# Patient Record
Sex: Female | Born: 1990 | Race: Black or African American | Hispanic: No | Marital: Single | Smoking: Never smoker
Health system: Southern US, Community
[De-identification: ages and names within clinical notes are randomized; demographics above are authoritative.]

## PROBLEM LIST (undated history)

## (undated) DIAGNOSIS — G35 Multiple sclerosis: Secondary | ICD-10-CM

## (undated) DIAGNOSIS — E559 Vitamin D deficiency, unspecified: Secondary | ICD-10-CM

## (undated) DIAGNOSIS — R0602 Shortness of breath: Secondary | ICD-10-CM

## (undated) DIAGNOSIS — E282 Polycystic ovarian syndrome: Secondary | ICD-10-CM

## (undated) DIAGNOSIS — N915 Oligomenorrhea, unspecified: Secondary | ICD-10-CM

## (undated) DIAGNOSIS — M549 Dorsalgia, unspecified: Secondary | ICD-10-CM

## (undated) HISTORY — DX: Polycystic ovarian syndrome: E28.2

## (undated) HISTORY — DX: Dorsalgia, unspecified: M54.9

## (undated) HISTORY — DX: Multiple sclerosis: G35

## (undated) HISTORY — PX: TONSILLECTOMY: SUR1361

## (undated) HISTORY — DX: Vitamin D deficiency, unspecified: E55.9

## (undated) HISTORY — DX: Shortness of breath: R06.02

## (undated) HISTORY — DX: Oligomenorrhea, unspecified: N91.5

---

## 2015-03-04 LAB — HM PAP SMEAR: HM PAP: NEGATIVE

## 2015-09-09 ENCOUNTER — Emergency Department
Admission: EM | Admit: 2015-09-09 | Discharge: 2015-09-09 | Disposition: A | Discharge status: Home / Self Care | Payer: BLUE CROSS/BLUE SHIELD | Attending: Emergency Medicine | Admitting: Emergency Medicine

## 2015-09-09 ENCOUNTER — Emergency Department: Payer: BLUE CROSS/BLUE SHIELD

## 2015-09-09 ENCOUNTER — Encounter: Payer: Self-pay | Admitting: Emergency Medicine

## 2015-09-09 DIAGNOSIS — R350 Frequency of micturition: Secondary | ICD-10-CM | POA: Insufficient documentation

## 2015-09-09 DIAGNOSIS — R300 Dysuria: Secondary | ICD-10-CM | POA: Diagnosis not present

## 2015-09-09 DIAGNOSIS — N23 Unspecified renal colic: Secondary | ICD-10-CM | POA: Diagnosis not present

## 2015-09-09 DIAGNOSIS — R109 Unspecified abdominal pain: Secondary | ICD-10-CM | POA: Diagnosis present

## 2015-09-09 DIAGNOSIS — R319 Hematuria, unspecified: Secondary | ICD-10-CM | POA: Insufficient documentation

## 2015-09-09 LAB — CBC WITH DIFFERENTIAL/PLATELET
BASOS ABS: 0.1 10*3/uL (ref 0–0.1)
Basophils Relative: 1 %
EOS PCT: 1 %
Eosinophils Absolute: 0.1 10*3/uL (ref 0–0.7)
HEMATOCRIT: 38.8 % (ref 35.0–47.0)
Hemoglobin: 13 g/dL (ref 12.0–16.0)
LYMPHS ABS: 2.7 10*3/uL (ref 1.0–3.6)
LYMPHS PCT: 28 %
MCH: 28.8 pg (ref 26.0–34.0)
MCHC: 33.6 g/dL (ref 32.0–36.0)
MCV: 85.6 fL (ref 80.0–100.0)
MONO ABS: 0.7 10*3/uL (ref 0.2–0.9)
MONOS PCT: 7 %
NEUTROS ABS: 5.9 10*3/uL (ref 1.4–6.5)
Neutrophils Relative %: 63 %
PLATELETS: 378 10*3/uL (ref 150–440)
RBC: 4.53 MIL/uL (ref 3.80–5.20)
RDW: 13.1 % (ref 11.5–14.5)
WBC: 9.4 10*3/uL (ref 3.6–11.0)

## 2015-09-09 LAB — COMPREHENSIVE METABOLIC PANEL
ALT: 23 U/L (ref 14–54)
ANION GAP: 9 (ref 5–15)
AST: 20 U/L (ref 15–41)
Albumin: 4 g/dL (ref 3.5–5.0)
Alkaline Phosphatase: 59 U/L (ref 38–126)
BILIRUBIN TOTAL: 0.6 mg/dL (ref 0.3–1.2)
BUN: 13 mg/dL (ref 6–20)
CHLORIDE: 106 mmol/L (ref 101–111)
CO2: 25 mmol/L (ref 22–32)
Calcium: 9 mg/dL (ref 8.9–10.3)
Creatinine, Ser: 0.88 mg/dL (ref 0.44–1.00)
Glucose, Bld: 110 mg/dL — ABNORMAL HIGH (ref 65–99)
POTASSIUM: 3.3 mmol/L — AB (ref 3.5–5.1)
Sodium: 140 mmol/L (ref 135–145)
TOTAL PROTEIN: 8.5 g/dL — AB (ref 6.5–8.1)

## 2015-09-09 LAB — URINALYSIS COMPLETE WITH MICROSCOPIC (ARMC ONLY)
Bacteria, UA: NONE SEEN
SQUAMOUS EPITHELIAL / LPF: NONE SEEN
Specific Gravity, Urine: 1.032 — ABNORMAL HIGH (ref 1.005–1.030)
WBC, UA: NONE SEEN WBC/hpf (ref 0–5)

## 2015-09-09 LAB — LIPASE, BLOOD: LIPASE: 35 U/L (ref 11–51)

## 2015-09-09 LAB — POCT PREGNANCY, URINE: Preg Test, Ur: NEGATIVE

## 2015-09-09 MED ORDER — KETOROLAC TROMETHAMINE 10 MG PO TABS: 10.0000 mg | ORAL_TABLET | Freq: Three times a day (TID) | ORAL | 0 refills | Status: DC | PRN

## 2015-09-09 MED ORDER — KETOROLAC TROMETHAMINE 30 MG/ML IJ SOLN
30.0000 mg | Freq: Once | INTRAMUSCULAR | Status: AC
  Administered 2015-09-09: 30 mg via INTRAVENOUS

## 2015-09-09 MED ORDER — OXYCODONE-ACETAMINOPHEN 5-325 MG PO TABS: 1.0000 | ORAL_TABLET | ORAL | 0 refills | Status: DC | PRN

## 2015-09-09 MED ORDER — MORPHINE SULFATE (PF) 2 MG/ML IV SOLN
2.0000 mg | Freq: Once | INTRAVENOUS | Status: AC
  Administered 2015-09-09: 2 mg via INTRAVENOUS
  Filled 2015-09-09: qty 1

## 2015-09-09 MED ORDER — ONDANSETRON HCL 4 MG/2ML IJ SOLN
4.0000 mg | Freq: Once | INTRAMUSCULAR | Status: AC
  Administered 2015-09-09: 4 mg via INTRAVENOUS
  Filled 2015-09-09: qty 2

## 2015-09-09 MED ORDER — ONDANSETRON HCL 4 MG PO TABS: 4.0000 mg | ORAL_TABLET | Freq: Three times a day (TID) | ORAL | 0 refills | Status: DC | PRN

## 2015-09-09 MED ORDER — KETOROLAC TROMETHAMINE 30 MG/ML IJ SOLN
INTRAMUSCULAR | Status: AC
  Administered 2015-09-09: 30 mg via INTRAVENOUS
  Filled 2015-09-09: qty 1

## 2015-09-09 MED ORDER — TAMSULOSIN HCL 0.4 MG PO CAPS: 0.4000 mg | ORAL_CAPSULE | Freq: Every day | ORAL | 0 refills | Status: DC

## 2015-09-09 MED ORDER — MORPHINE SULFATE (PF) 4 MG/ML IV SOLN
4.0000 mg | Freq: Once | INTRAVENOUS | Status: AC
  Administered 2015-09-09: 4 mg via INTRAVENOUS
  Filled 2015-09-09: qty 1

## 2015-09-09 NOTE — ED Notes (Signed)
Patient transported to Ultrasound at this time. 

## 2015-09-09 NOTE — ED Provider Notes (Signed)
[facility] Emergency Department Provider Note ____________________________________________   I have reviewed the triage vital signs and the triage nursing note.  HISTORY  Chief Complaint Flank Pain   Historian Patient  HPI Angelica Robles is a 25 y.o. female history of acute onset right flank into the right lower quadrant pain this morning. The waxing and waning but essentially severe. No history of kidney stones. No right upper quadrant pain. Nausea without vomiting. No fevers. Denies diarrhea. She is currently on her period.    History reviewed. No pertinent past medical history.  There are no active problems to display for this patient.   Past Surgical History:  Procedure Laterality Date  . TONSILLECTOMY      Prior to Admission medications   Medication Sig Start Date End Date Taking? Authorizing Provider  ketorolac (TORADOL) 10 MG tablet Take 1 tablet (10 mg total) by mouth every 8 (eight) hours as needed for moderate pain. 09/09/15   Governor Rooks, MD  ondansetron (ZOFRAN) 4 MG tablet Take 1 tablet (4 mg total) by mouth every 8 (eight) hours as needed for nausea or vomiting. 09/09/15   Governor Rooks, MD  oxyCODONE-acetaminophen (ROXICET) 5-325 MG tablet Take 1 tablet by mouth every 4 (four) hours as needed for severe pain. 09/09/15   Governor Rooks, MD  tamsulosin (FLOMAX) 0.4 MG CAPS capsule Take 1 capsule (0.4 mg total) by mouth daily. 09/09/15   Governor Rooks, MD    No Known Allergies  No family history on file.  Social History Social History  Substance Use Topics  . Smoking status: Never Smoker  . Smokeless tobacco: Never Used  . Alcohol use No    Review of Systems  Constitutional: Negative for fever. Eyes: Negative for visual changes. ENT: Negative for sore throat. Cardiovascular: Negative for chest pain. Respiratory: Negative for shortness of breath. Gastrointestinal: Negative for diarrhea. Genitourinary: Positive for frequency and  dysuria. Musculoskeletal: Negative for back pain. Skin: Negative for rash. Neurological: Negative for headache. 10 point Review of Systems otherwise negative ____________________________________________   PHYSICAL EXAM:  VITAL SIGNS: ED Triage Vitals  Enc Vitals Group     BP 09/09/15 0626 106/88     Pulse Rate 09/09/15 0626 81     Resp 09/09/15 0626 18     Temp 09/09/15 0626 98.3 F (36.8 C)     Temp Source 09/09/15 0626 Oral     SpO2 09/09/15 0626 100 %     Weight 09/09/15 0619 230 lb (104.3 kg)     Height 09/09/15 0619 5\' 1"  (1.549 m)     Head Circumference --      Peak Flow --      Pain Score 09/09/15 0621 8     Pain Loc --      Pain Edu? --      Excl. in GC? --      Constitutional: Alert and oriented. Well appearing and in no distress. HEENT   Head: Normocephalic and atraumatic.      Eyes: Conjunctivae are normal. PERRL. Normal extraocular movements.      Ears:         Nose: No congestion/rhinnorhea.   Mouth/Throat: Mucous membranes are moist.   Neck: No stridor. Cardiovascular/Chest: Normal rate, regular rhythm.  No murmurs, rubs, or gallops. Respiratory: Normal respiratory effort without tachypnea nor retractions. Breath sounds are clear and equal bilaterally. No wheezes/rales/rhonchi. Gastrointestinal: Soft. No distention, no guarding, no rebound. Mildly obese with mild tenderness to the right lower quadrant without  focal McBurney's point tenderness  Genitourinary/rectal:Deferred Musculoskeletal: Nontender with normal range of motion in all extremities. No joint effusions.  No lower extremity tenderness.  No edema. Neurologic:  Normal speech and language. No gross or focal neurologic deficits are appreciated. Skin:  Skin is warm, dry and intact. No rash noted. Psychiatric: Mood and affect are normal. Speech and behavior are normal. Patient exhibits appropriate insight and judgment.   ____________________________________________  LABS (pertinent  positives/negatives)  Labs Reviewed  COMPREHENSIVE METABOLIC PANEL - Abnormal; Notable for the following:       Result Value   Potassium 3.3 (*)    Glucose, Bld 110 (*)    Total Protein 8.5 (*)    All other components within normal limits  URINALYSIS COMPLETEWITH MICROSCOPIC ([facility] ONLY) - Abnormal; Notable for the following:    Color, Urine RED (*)    APPearance CLOUDY (*)    Glucose, UA   (*)    Value: TEST NOT REPORTED DUE TO COLOR INTERFERENCE OF URINE PIGMENT   Bilirubin Urine   (*)    Value: TEST NOT REPORTED DUE TO COLOR INTERFERENCE OF URINE PIGMENT   Ketones, ur   (*)    Value: TEST NOT REPORTED DUE TO COLOR INTERFERENCE OF URINE PIGMENT   Specific Gravity, Urine 1.032 (*)    Hgb urine dipstick   (*)    Value: TEST NOT REPORTED DUE TO COLOR INTERFERENCE OF URINE PIGMENT   Protein, ur   (*)    Value: TEST NOT REPORTED DUE TO COLOR INTERFERENCE OF URINE PIGMENT   Nitrite   (*)    Value: TEST NOT REPORTED DUE TO COLOR INTERFERENCE OF URINE PIGMENT   Leukocytes, UA   (*)    Value: TEST NOT REPORTED DUE TO COLOR INTERFERENCE OF URINE PIGMENT   All other components within normal limits  CBC WITH DIFFERENTIAL/PLATELET  LIPASE, BLOOD  POCT PREGNANCY, URINE    ____________________________________________    EKG I, Governor Rooksebecca Amman Bartel, MD, the attending physician have personally viewed and interpreted all ECGs.  None ____________________________________________  RADIOLOGY All Xrays were viewed by me. Imaging interpreted by Radiologist.  Renal ultrasound: IMPRESSION: 1.  Mild right hydronephrosis and hydroureter.  2.  Exam otherwise unremarkable. __________________________________________  PROCEDURES  Procedure(s) performed: None  Critical Care performed: None  ____________________________________________   ED COURSE / ASSESSMENT AND PLAN  Pertinent labs & imaging results that were available during my care of the patient were reviewed by me and considered in my  medical decision making (see chart for details).   [name] is here with symptoms clinically consistent with ureteral colic and here had gross hematuria, not associated for menstrual period.  Symptoms controlled with pain medication here. I discussed with mom and patient my recommendation for either CT for final diagnosis, or I would more likely recommend ultrasound and treat presumptively for renal stone. We chose to avoid CT scan at this point time.   Symptoms not concerning for renal failure, or pyelonephritis. Not at this point concerned about intra-abdominal other emergency such as appendicitis, or pelvic emergency.    CONSULTATIONS:   None   Patient / Family / Caregiver informed of clinical course, medical decision-making process, and agree with plan.   I discussed return precautions, follow-up instructions, and discharge instructions with patient and/or family.   ___________________________________________   FINAL CLINICAL IMPRESSION(S) / ED DIAGNOSES   Final diagnoses:  Hematuria  Ureteral colic              Note: This  dictation was prepared with Dragon dictation. Any transcriptional errors that result from this process are unintentional    Governor Rooks, MD 09/09/15 1041

## 2015-09-09 NOTE — Discharge Instructions (Signed)
You were evaluated her right flank and abdominal pain, and bloody urine, and your evaluation seems consistent with kidney stone. As we discussed, I did not recommend imaging today with CT scan.  Strain your urine and if you collect a stone, turn it into your primary care physician's office or [facility] clinic for analysis.  Return to the emergency room for any worsening pain, inability to urinate, fever, vomiting and cannot keep her medications down, or any other symptoms concerning to you.

## 2015-09-09 NOTE — ED Triage Notes (Signed)
Pt to triage via w/c, appears uncomfortable; st awoke at 430am with right flank pain radiating into right abd with no accomp symptoms

## 2015-09-10 LAB — URINE CULTURE

## 2016-05-24 ENCOUNTER — Other Ambulatory Visit: Payer: Self-pay

## 2016-05-24 MED ORDER — NORETHIN-ETH ESTRAD-FE BIPHAS 1 MG-10 MCG / 10 MCG PO TABS: 1.0000 | ORAL_TABLET | Freq: Every day | ORAL | 1 refills | Status: DC

## 2016-05-24 NOTE — Telephone Encounter (Signed)
Pt aware refills eRx'd. 

## 2016-06-22 ENCOUNTER — Other Ambulatory Visit: Payer: Self-pay

## 2016-06-22 MED ORDER — NORETHIN-ETH ESTRAD-FE BIPHAS 1 MG-10 MCG / 10 MCG PO TABS: 1.0000 | ORAL_TABLET | Freq: Every day | ORAL | 1 refills | Status: DC

## 2016-07-03 ENCOUNTER — Ambulatory Visit: Payer: Self-pay | Admitting: Obstetrics and Gynecology

## 2016-08-09 ENCOUNTER — Ambulatory Visit: Payer: Self-pay | Admitting: Obstetrics and Gynecology

## 2016-08-10 ENCOUNTER — Telehealth: Payer: Self-pay | Admitting: Obstetrics & Gynecology

## 2016-08-10 NOTE — Telephone Encounter (Signed)
LVM for patient to call back to r/s due to EPIC Outage 08/10/16

## 2016-08-14 ENCOUNTER — Other Ambulatory Visit: Payer: Self-pay

## 2016-08-14 MED ORDER — NORETHIN-ETH ESTRAD-FE BIPHAS 1 MG-10 MCG / 10 MCG PO TABS: 1.0000 | ORAL_TABLET | Freq: Every day | ORAL | 0 refills | Status: DC

## 2016-08-14 NOTE — Telephone Encounter (Signed)
rx sent pt aware 

## 2016-08-23 ENCOUNTER — Other Ambulatory Visit: Payer: Self-pay | Admitting: Obstetrics and Gynecology

## 2016-08-24 ENCOUNTER — Other Ambulatory Visit: Payer: Self-pay | Admitting: Obstetrics and Gynecology

## 2016-08-25 ENCOUNTER — Other Ambulatory Visit: Payer: Self-pay | Admitting: Obstetrics and Gynecology

## 2016-08-25 NOTE — Telephone Encounter (Signed)
Needs annual scheduled refilled for 2 months

## 2016-08-30 ENCOUNTER — Encounter: Payer: Self-pay | Admitting: Obstetrics and Gynecology

## 2016-09-05 ENCOUNTER — Ambulatory Visit (INDEPENDENT_AMBULATORY_CARE_PROVIDER_SITE_OTHER): Payer: 59 | Admitting: Obstetrics and Gynecology

## 2016-09-05 ENCOUNTER — Encounter: Payer: Self-pay | Admitting: Obstetrics and Gynecology

## 2016-09-05 VITALS — BP 130/78 | HR 113 | Ht 61.0 in | Wt 242.0 lb

## 2016-09-05 DIAGNOSIS — E669 Obesity, unspecified: Secondary | ICD-10-CM

## 2016-09-05 DIAGNOSIS — Z01419 Encounter for gynecological examination (general) (routine) without abnormal findings: Secondary | ICD-10-CM

## 2016-09-05 DIAGNOSIS — N912 Amenorrhea, unspecified: Secondary | ICD-10-CM | POA: Diagnosis not present

## 2016-09-05 DIAGNOSIS — Z6841 Body Mass Index (BMI) 40.0 and over, adult: Secondary | ICD-10-CM

## 2016-09-05 DIAGNOSIS — N911 Secondary amenorrhea: Secondary | ICD-10-CM

## 2016-09-05 DIAGNOSIS — IMO0001 Reserved for inherently not codable concepts without codable children: Secondary | ICD-10-CM

## 2016-09-05 LAB — POCT URINE PREGNANCY: Preg Test, Ur: NEGATIVE

## 2016-09-05 MED ORDER — MEDROXYPROGESTERONE ACETATE 10 MG PO TABS: 10.0000 mg | ORAL_TABLET | Freq: Every day | ORAL | 0 refills | Status: DC

## 2016-09-05 NOTE — Patient Instructions (Signed)
Preventive Care 18-39 Years, Female Preventive care refers to lifestyle choices and visits with your health care provider that can promote health and wellness. What does preventive care include?  A yearly physical exam. This is also called an annual well check.  Dental exams once or twice a year.  Routine eye exams. Ask your health care provider how often you should have your eyes checked.  Personal lifestyle choices, including: ? Daily care of your teeth and gums. ? Regular physical activity. ? Eating a healthy diet. ? Avoiding tobacco and drug use. ? Limiting alcohol use. ? Practicing safe sex. ? Taking vitamin and mineral supplements as recommended by your health care provider. What happens during an annual well check? The services and screenings done by your health care provider during your annual well check will depend on your age, overall health, lifestyle risk factors, and family history of disease. Counseling Your health care provider may ask you questions about your:  Alcohol use.  Tobacco use.  Drug use.  Emotional well-being.  Home and relationship well-being.  Sexual activity.  Eating habits.  Work and work Statistician.  Method of birth control.  Menstrual cycle.  Pregnancy history.  Screening You may have the following tests or measurements:  Height, weight, and BMI.  Diabetes screening. This is done by checking your blood sugar (glucose) after you have not eaten for a while (fasting).  Blood pressure.  Lipid and cholesterol levels. These may be checked every 5 years starting at age 66.  Skin check.  Hepatitis C blood test.  Hepatitis B blood test.  Sexually transmitted disease (STD) testing.  BRCA-related cancer screening. This may be done if you have a family history of breast, ovarian, tubal, or peritoneal cancers.  Pelvic exam and Pap test. This may be done every 3 years starting at age 40. Starting at age 59, this may be done every 5  years if you have a Pap test in combination with an HPV test.  Discuss your test results, treatment options, and if necessary, the need for more tests with your health care provider. Vaccines Your health care provider may recommend certain vaccines, such as:  Influenza vaccine. This is recommended every year.  Tetanus, diphtheria, and acellular pertussis (Tdap, Td) vaccine. You may need a Td booster every 10 years.  Varicella vaccine. You may need this if you have not been vaccinated.  HPV vaccine. If you are 69 or younger, you may need three doses over 6 months.  Measles, mumps, and rubella (MMR) vaccine. You may need at least one dose of MMR. You may also need a second dose.  Pneumococcal 13-valent conjugate (PCV13) vaccine. You may need this if you have certain conditions and were not previously vaccinated.  Pneumococcal polysaccharide (PPSV23) vaccine. You may need one or two doses if you smoke cigarettes or if you have certain conditions.  Meningococcal vaccine. One dose is recommended if you are age 27-21 years and a first-year college student living in a residence hall, or if you have one of several medical conditions. You may also need additional booster doses.  Hepatitis A vaccine. You may need this if you have certain conditions or if you travel or work in places where you may be exposed to hepatitis A.  Hepatitis B vaccine. You may need this if you have certain conditions or if you travel or work in places where you may be exposed to hepatitis B.  Haemophilus influenzae type b (Hib) vaccine. You may need this if  you have certain risk factors.  Talk to your health care provider about which screenings and vaccines you need and how often you need them. This information is not intended to replace advice given to you by your health care provider. Make sure you discuss any questions you have with your health care provider. Document Released: 02/14/2001 Document Revised: 09/08/2015  Document Reviewed: 10/20/2014 Elsevier Interactive Patient Education  2017 [employer].

## 2016-09-05 NOTE — Progress Notes (Signed)
Patient ID: Angelica Robles, female   DOB: 1990-07-04, 26 y.o.   MRN: [id number]     Gynecology Annual Exam  PCP: [facility] [place], [place]  Chief Complaint:  Chief Complaint  Patient presents with  . Gynecologic Exam    missed cycle x going on 3 months    History of Present Illness: Patient is a 26 y.o. G0P0000 presents for annual exam. The patient has no complaints today.   LMP: Patient's last menstrual period was 07/07/2016 (exact date). Average Interval: irregular, 30-90 days days Duration of flow: 7-21 days days Heavy Menses: yes Clots: yes Intermenstrual Bleeding: no Postcoital Bleeding: no Dysmenorrhea: yes  The patient is sexually active. She currently uses none for contraception. She denies dyspareunia.  The patient does perform self breast exams.  There is no notable family history of breast or ovarian cancer in her family.  The patient wears seatbelts: yes.  The patient has regular exercise: not asked.    The patient denies current symptoms of depression.    Mcguirt standing history of anovulatory cycles.  Has had normal TVUS and endometrial biopsy.  Last time labs were evaluate was 2014.  She has been managed with OCP's in the past.  She is not currently interested in conceiving.   Hyperprolactinemia:  Denies nipple discharge, headaches, vision changes Thyroid:  Does report cold intolerance, weight stable, some constipation, no skin or hair changes PCOS: mild hirsutism, no acne, weight stable    Review of Systems: Review of Systems  Constitutional: Negative for chills and fever.  HENT: Negative for congestion.   Respiratory: Negative for cough and shortness of breath.   Cardiovascular: Negative for chest pain and palpitations.  Gastrointestinal: Negative for abdominal pain, constipation, diarrhea, heartburn, nausea and vomiting.  Genitourinary: Negative for dysuria, frequency and urgency.  Skin: Negative for itching and rash.  Neurological: Negative for  dizziness and headaches.  Endo/Heme/Allergies: Negative for polydipsia.  Psychiatric/Behavioral: Negative for depression.    Past Medical History:  Past Medical History:  Diagnosis Date  . Oligomenorrhea     Past Surgical History:  Past Surgical History:  Procedure Laterality Date  . TONSILLECTOMY      Gynecologic History:  Patient's last menstrual period was 07/07/2016 (exact date). Contraception: none Last Pap: Results were: 03/04/15 no abnormalities   Obstetric History: G0P0000  Family History:  Family History  Problem Relation Age of Onset  . Prostate cancer Father 45  . Breast cancer Maternal Grandmother 56    Social History:  Social History   Social History  . Marital status: Single    Spouse name: N/A  . Number of children: N/A  . Years of education: N/A   Occupational History  . Not on file.   Social History Main Topics  . Smoking status: Never Smoker  . Smokeless tobacco: Never Used  . Alcohol use No  . Drug use: No  . Sexual activity: Yes    Birth control/ protection: Pill   Other Topics Concern  . Not on file   Social History Narrative  . No narrative on file    Allergies:  No Known Allergies  Medications: Prior to Admission medications   Medication Sig Start Date End Date Taking? Authorizing Provider  Norethindrone-Ethinyl Estradiol-Fe Biphas (LO LOESTRIN FE) 1 MG-10 MCG / 10 MCG tablet Take 1 tablet by mouth daily. 08/25/16  Yes Vena Austria, MD    Physical Exam Vitals: Blood pressure 130/78, pulse (!) 113, height 5\' 1"  (1.549 m), weight 242 lb (109.8  kg), last menstrual period 07/07/2016. Was 234 1 year ago  General: NAD HEENT: normocephalic, anicteric Thyroid: no enlargement, no palpable nodules Pulmonary: No increased work of breathing, CTAB Cardiovascular: RRR, distal pulses 2+ Breast: Breast symmetrical, no tenderness, no palpable nodules or masses, no skin or nipple retraction present, no nipple discharge.  No axillary  or supraclavicular lymphadenopathy. Abdomen: NABS, soft, non-tender, non-distended.  Umbilicus without lesions.  No hepatomegaly, splenomegaly or masses palpable. No evidence of hernia  Genitourinary:  External: Normal external female genitalia.  Normal urethral meatus, normal  Bartholin's and Skene's glands.    Vagina: Normal vaginal mucosa, no evidence of prolapse.    Cervix: Grossly normal in appearance, no bleeding  Uterus: Non-enlarged, mobile, normal contour.  No CMT  Adnexa: ovaries non-enlarged, no adnexal masses  Rectal: deferred  Lymphatic: no evidence of inguinal lymphadenopathy Extremities: no edema, erythema, or tenderness Neurologic: Grossly intact Psychiatric: mood appropriate, affect full  Female chaperone present for pelvic and breast  portions of the physical exam    Assessment: 26 y.o. G0P0000 routine annual exam, secondary amenorrhea  Plan: Problem List Items Addressed This Visit    None    Visit Diagnoses    Amenorrhea    -  Primary   Relevant Orders   POCT urine pregnancy (Completed)   TSH+Prl+FSH+TestT+LH+DHEA S...   Encounter for gynecological examination without abnormal finding       Relevant Orders   TSH+Prl+FSH+TestT+LH+DHEA S...   Hemoglobin A1c   Secondary amenorrhea       Relevant Orders   TSH+Prl+FSH+TestT+LH+DHEA S...   Hemoglobin A1c   Class 3 obesity with serious comorbidity and body mass index (BMI) of 40.0 to 44.9 in adult, unspecified obesity type (HCC)       Relevant Orders   TSH+Prl+FSH+TestT+LH+DHEA S...   Hemoglobin A1c      1) 4) Gardasil Series discussed and if applicable offered to patient - Patient has not previously completed 3 shot series, declined previously  2) STI screening was offered and declined   3) ASCCP guidelines and rational discussed.  Patient opts for every 3 years screening interval  4) Contraception - discussed options for management.  Start po progestin if labs normal vs OCP.  Suspect secondary  amenorrhea secondary to PCOS  5) Follow up 1 year for routine annual exam

## 2016-09-08 LAB — HEMOGLOBIN A1C
Est. average glucose Bld gHb Est-mCnc: 111 mg/dL
HEMOGLOBIN A1C: 5.5 % (ref 4.8–5.6)

## 2016-09-08 LAB — TSH+PRL+FSH+TESTT+LH+DHEA S...
17 HYDROXYPROGESTERONE: 17 ng/dL
Androstenedione: 50 ng/dL (ref 41–262)
DHEA-SO4: 210 ug/dL (ref 84.8–378.0)
FSH: 5.2 m[IU]/mL
LH: 8 m[IU]/mL
PROLACTIN: 21.2 ng/mL (ref 4.8–23.3)
TESTOSTERONE FREE: 1.7 pg/mL (ref 0.0–4.2)
TESTOSTERONE: 31 ng/dL (ref 8–48)
TSH: 1.57 u[IU]/mL (ref 0.450–4.500)

## 2016-10-02 ENCOUNTER — Other Ambulatory Visit: Payer: Self-pay | Admitting: Obstetrics and Gynecology

## 2016-10-02 MED ORDER — NORETHIN-ETH ESTRAD-FE BIPHAS 1 MG-10 MCG / 10 MCG PO TABS: 1.0000 | ORAL_TABLET | Freq: Every day | ORAL | 1 refills | Status: DC

## 2016-10-02 NOTE — Telephone Encounter (Signed)
Needs appointment

## 2016-10-02 NOTE — Telephone Encounter (Signed)
Annual was 09/05/2016, What does she need to be seen for? I need to let front desk know what to schedule

## 2016-10-02 NOTE — Telephone Encounter (Signed)
Sorry this rx was for withdrawal bleed only  she should start a regular birth control pill now I've resent that rx

## 2016-10-03 NOTE — Telephone Encounter (Signed)
Attempted to reach patient her Voicemail box was full. Called and spoke with Angelica Robles patient's mother about scheduling appt 10/06/16 with AMS for medication follow. Angelica Robles was advise to contact paient to have her call back back to comfirm schedule appt/

## 2016-10-03 NOTE — Telephone Encounter (Signed)
Please call patient and schedule for medication follow up. First available Thank you

## 2016-10-06 ENCOUNTER — Encounter: Payer: Self-pay | Admitting: Obstetrics and Gynecology

## 2016-10-06 ENCOUNTER — Ambulatory Visit: Payer: 59 | Admitting: Obstetrics and Gynecology

## 2016-10-06 ENCOUNTER — Ambulatory Visit (INDEPENDENT_AMBULATORY_CARE_PROVIDER_SITE_OTHER): Payer: 59 | Admitting: Obstetrics and Gynecology

## 2016-10-06 VITALS — BP 118/78 | HR 92 | Ht 61.0 in | Wt 241.0 lb

## 2016-10-06 DIAGNOSIS — E282 Polycystic ovarian syndrome: Secondary | ICD-10-CM | POA: Diagnosis not present

## 2016-10-06 DIAGNOSIS — N911 Secondary amenorrhea: Secondary | ICD-10-CM

## 2016-10-06 NOTE — Progress Notes (Signed)
Obstetrics & Gynecology Office Visit   Chief Complaint:  Chief Complaint  Patient presents with  . Medication Refill    History of Present Illness: 26 year old AAF presenting for follow up of secondary amenorrhea.  No history of prior uterine procedures.  Ultrasound 1 year ago for AUB was normal.  She completed a PCOS panel which was negative although given amenorrhea, as well as clinical signs of androgen excess (hirsutism and acne), the most likely diagnosis is PCOS.  She completed at 10 day course of provera.  She did not have a full menstrual cycle but she did have some spotting following completion of the course.  She presents today to go over follow up recommendations.  Review of Systems: 10 point review of systems negative unless otherwise noted in HPI  Past Medical History:  Past Medical History:  Diagnosis Date  . Oligomenorrhea     Past Surgical History:  Past Surgical History:  Procedure Laterality Date  . TONSILLECTOMY      Gynecologic History: No LMP recorded.  Obstetric History: G0P0000  Family History:  Family History  Problem Relation Age of Onset  . Prostate cancer Father 65  . Breast cancer Maternal Grandmother 57    Social History:  Social History   Social History  . Marital status: Single    Spouse name: N/A  . Number of children: N/A  . Years of education: N/A   Occupational History  . Not on file.   Social History Main Topics  . Smoking status: Never Smoker  . Smokeless tobacco: Never Used  . Alcohol use No  . Drug use: No  . Sexual activity: Yes    Birth control/ protection: Pill   Other Topics Concern  . Not on file   Social History Narrative  . No narrative on file    Allergies:  No Known Allergies  Medications: Prior to Admission medications   Medication Sig Start Date End Date Taking? Authorizing Provider  medroxyPROGESTERone (PROVERA) 10 MG tablet TAKE 1 TABLET (10 MG TOTAL) BY MOUTH DAILY. START AFTER FIRST 14  DAYS OF PREMARIN 09/05/16  Yes [provider]  Norethindrone-Ethinyl Estradiol-Fe Biphas (LO LOESTRIN FE) 1 MG-10 MCG / 10 MCG tablet Take 1 tablet by mouth daily. 10/02/16  Yes Vena Austria, MD    Physical Exam Vitals:  Vitals:   10/06/16 1131  BP: 118/78  Pulse: 92   No LMP recorded.  General: NAD HEENT: normocephalic, anicteric Pulmonary: No increased work of breathing Extremities: no edema, erythema, or tenderness Neurologic: Grossly intact Psychiatric: mood appropriate, affect full  Female chaperone present for pelvic and breast  portions of the physical exam  Assessment: 26 y.o. G0P0000 follow up secondary amenorrhea  Plan: Problem List Items Addressed This Visit    None    Visit Diagnoses    Secondary amenorrhea    -  Primary     - Trial of taytulla (was previously on ortho-cyclen, prior to that lo loestring fe) - Has some light spotting but no real menstraul flow after provera - The patient is not currently interested in conceiving, we discussed in this setting endometrial protection is the primary treatment goal.  We dsicussed implications of extended periods of amenorrhea on development of endometrial hyperplasia.  ("Polycystic Ovarian Syndrome" ACOG Practice Bulletin 194 June 2018).   - A total of 15 minutes were spent in face-to-face contact with the patient during this encounter with over half of that time devoted to counseling and  coordination of care.

## 2016-12-04 ENCOUNTER — Telehealth: Payer: Self-pay

## 2016-12-04 ENCOUNTER — Other Ambulatory Visit: Payer: Self-pay | Admitting: Obstetrics and Gynecology

## 2016-12-04 MED ORDER — NORETHIN ACE-ETH ESTRAD-FE 1-20 MG-MCG(24) PO CAPS: 1.0000 | ORAL_CAPSULE | Freq: Every day | ORAL | 3 refills | Status: DC

## 2016-12-04 NOTE — Telephone Encounter (Signed)
Pt requesting refill of Taytulla. Cb#[phone]

## 2016-12-07 NOTE — Telephone Encounter (Signed)
Spoke w/pt. Gave info to activate coupon card. Pt has done & will contact pharmacy to verify cost & contact us back if she needs further assistance.

## 2016-12-07 NOTE — Telephone Encounter (Signed)
Spoke w/pharmacist. Gave Coupon code. Not valid until activated. Will contact pt to have her activate.

## 2016-12-07 NOTE — Telephone Encounter (Signed)
Pt states Taytulla is $100. Requesting coupon or generic. Cb#[phone]

## 2017-01-21 IMAGING — US US RENAL
1 series · 14 of 25 positions shown · non-contrast
Comparison: No recent prior.

CLINICAL DATA: Right flank pain.  Hematuria.

EXAM:
RENAL / URINARY TRACT ULTRASOUND COMPLETE

[Series 1: us renal · 0.23mm/px · 14 of 39 slices shown]
[im 1/39]
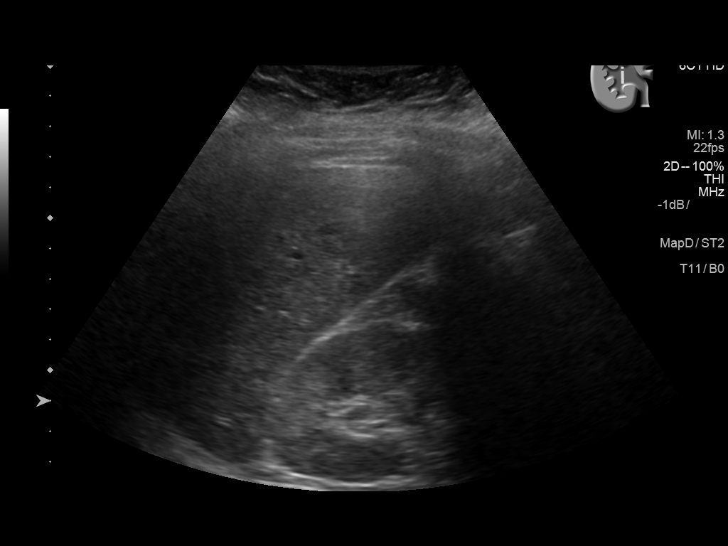
[im 4/39]
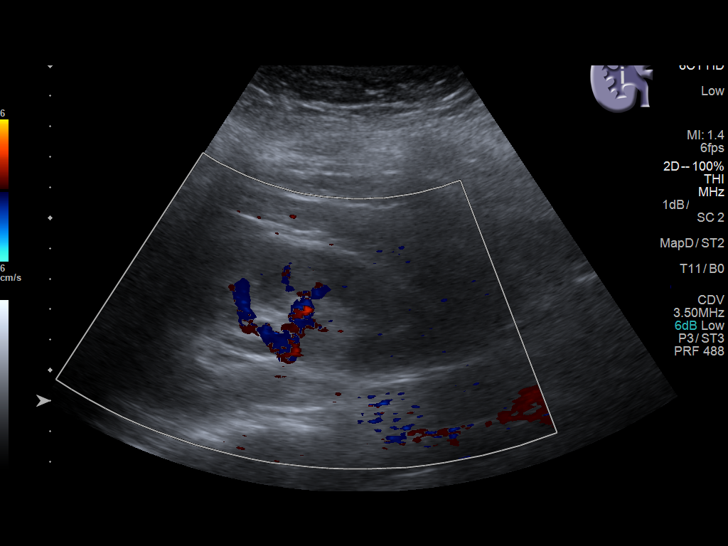
[im 7/39]
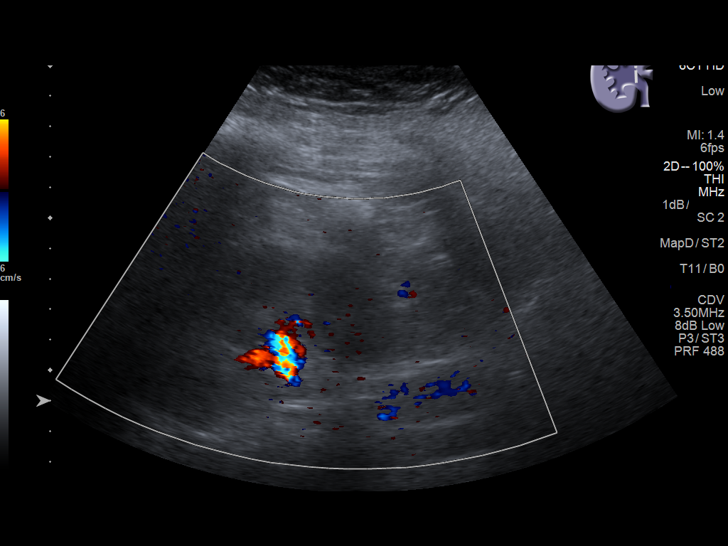
[im 10/39]
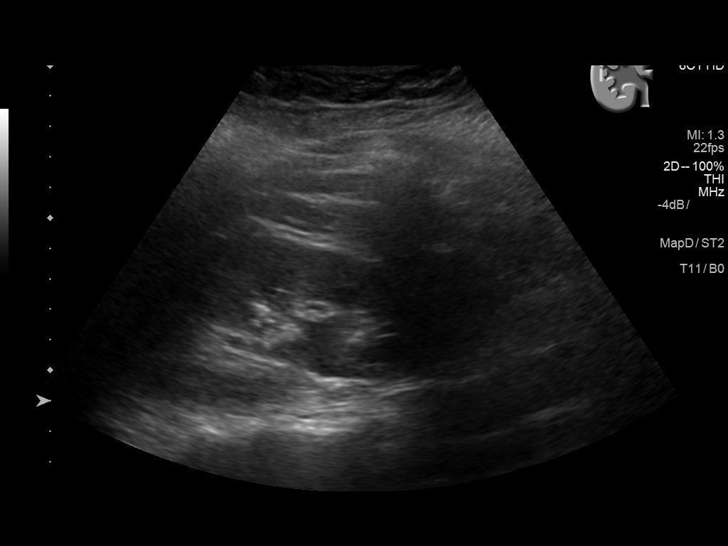
[im 13/39]
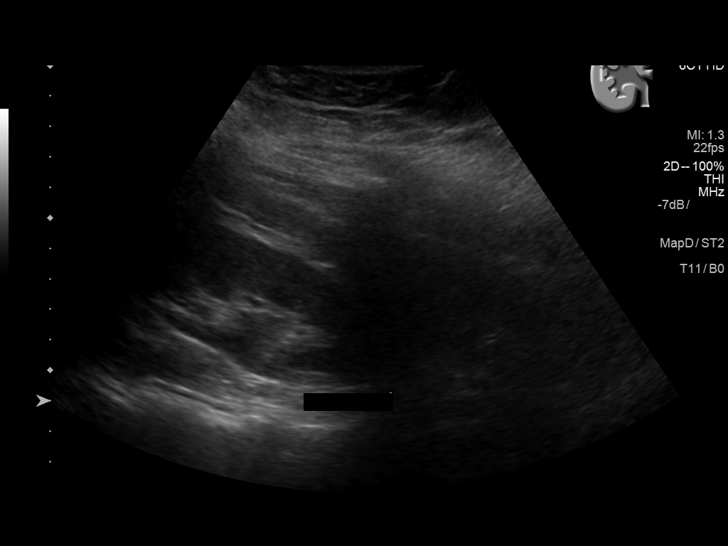
[im 15/39]
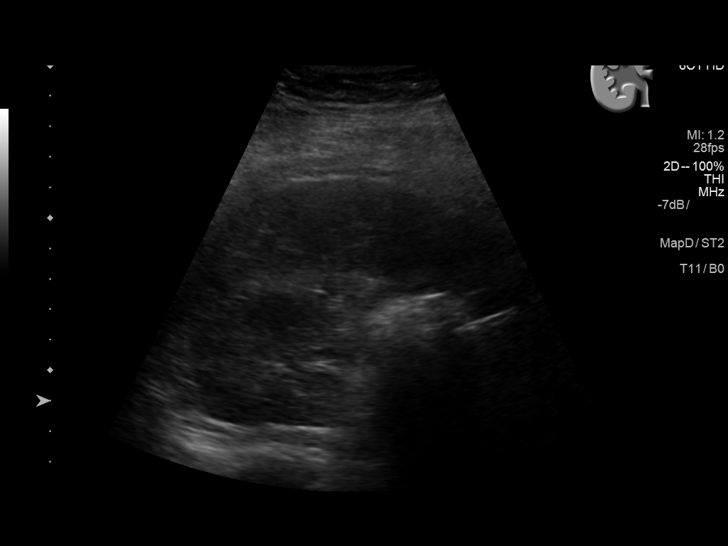
[im 18/39]
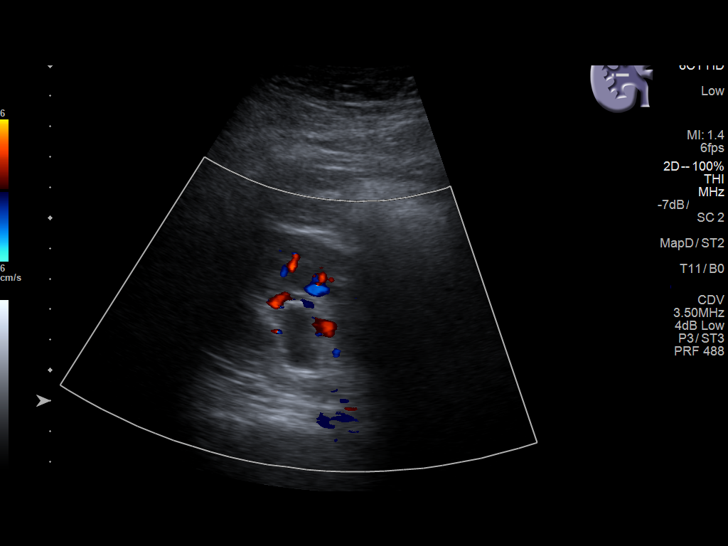
[im 21/39]
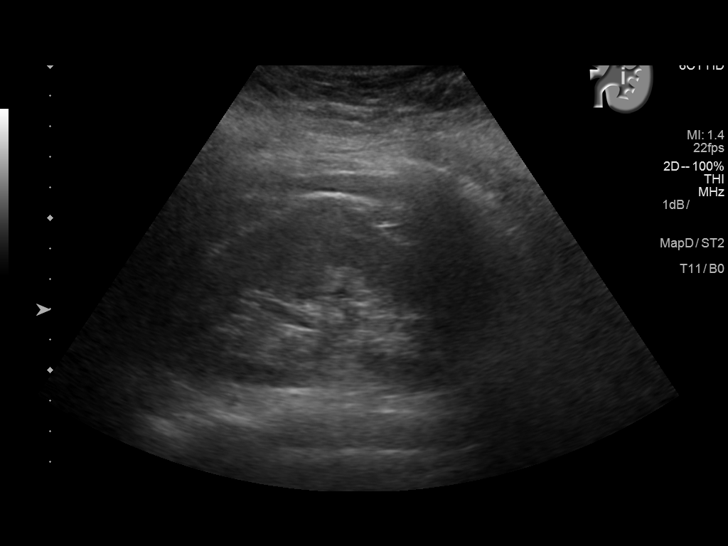
[im 24/39]
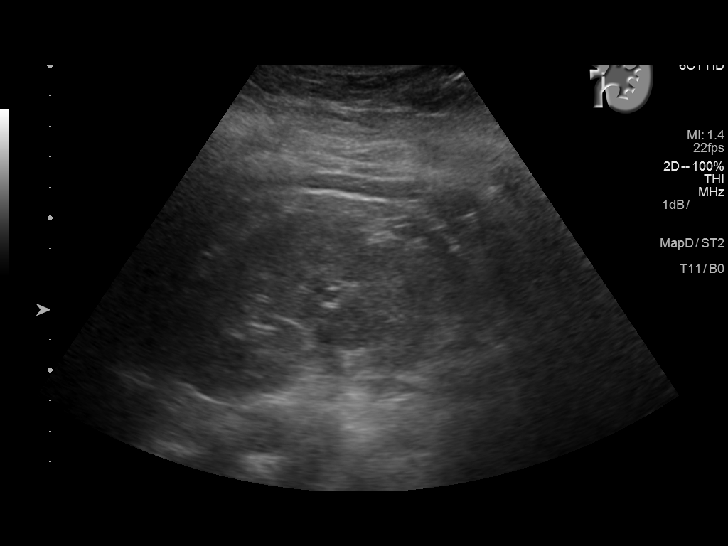
[im 26/39]
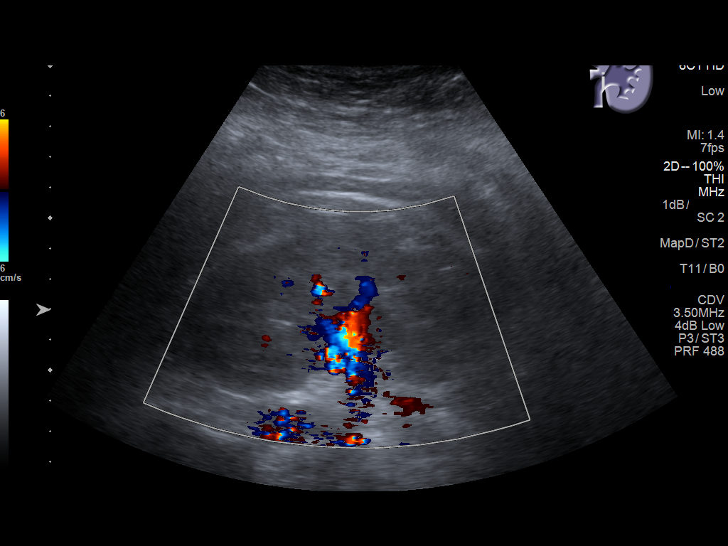
[im 29/39]
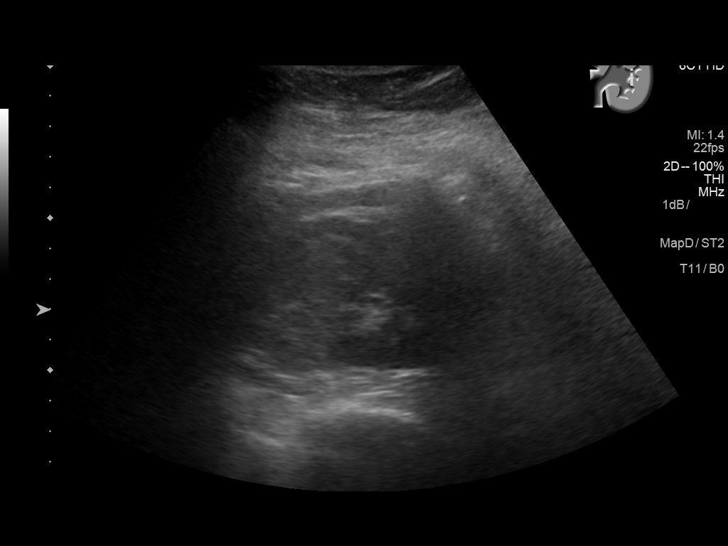
[im 32/39]
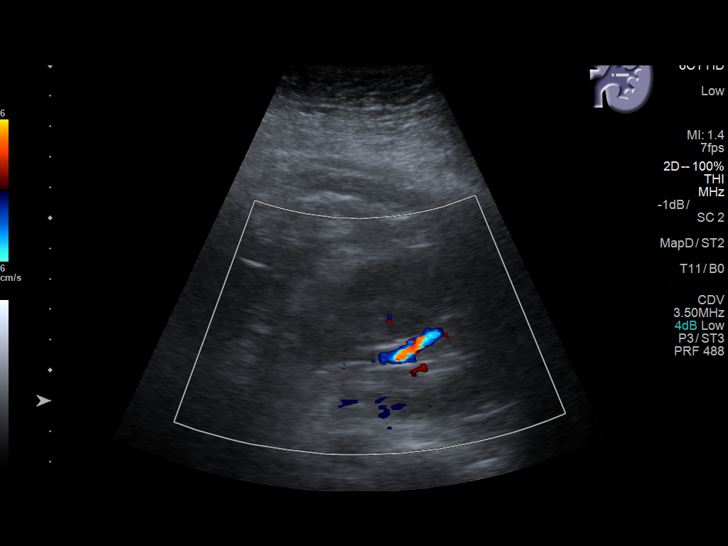
[im 35/39]
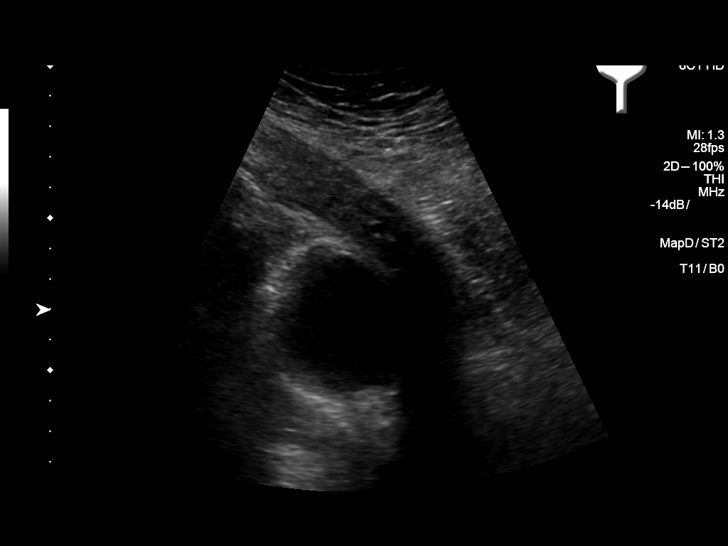
[im 39/39]
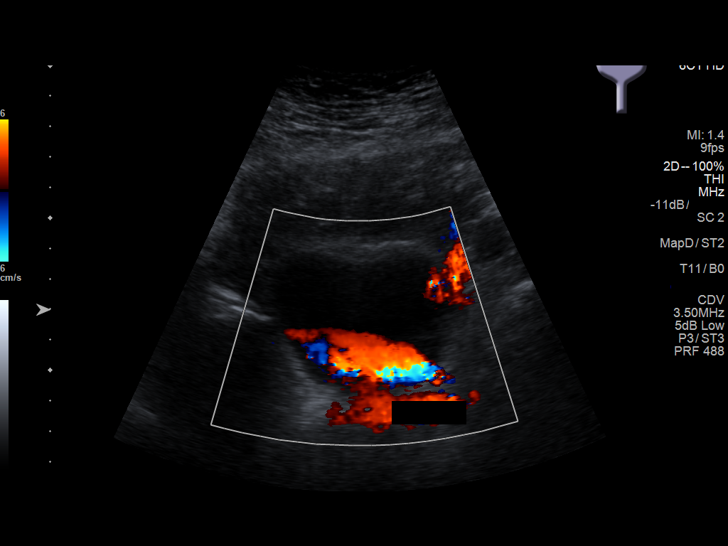

[14 of 25 positions shown; findings below may reference images not displayed]

FINDINGS: Right Kidney:

Length: 10.4 cm. Echogenicity within normal limits. No mass. Mild
right hydronephrosis and hydroureter noted.

Left Kidney:

Length: 10.1 cm. Echogenicity within normal limits. No mass or
hydronephrosis visualized.

Bladder:

Appears normal for degree of bladder distention. Bilateral ureteral
jets noted.
IMPRESSION: 1.  Mild right hydronephrosis and hydroureter.

2.  Exam otherwise unremarkable.

## 2017-09-17 ENCOUNTER — Ambulatory Visit (INDEPENDENT_AMBULATORY_CARE_PROVIDER_SITE_OTHER): Payer: Managed Care, Other (non HMO) | Admitting: Obstetrics and Gynecology

## 2017-09-17 ENCOUNTER — Encounter: Payer: Self-pay | Admitting: Obstetrics and Gynecology

## 2017-09-17 VITALS — BP 124/76 | HR 103 | Ht 61.0 in | Wt 252.0 lb

## 2017-09-17 DIAGNOSIS — Z1231 Encounter for screening mammogram for malignant neoplasm of breast: Secondary | ICD-10-CM

## 2017-09-17 DIAGNOSIS — Z131 Encounter for screening for diabetes mellitus: Secondary | ICD-10-CM | POA: Diagnosis not present

## 2017-09-17 DIAGNOSIS — Z01419 Encounter for gynecological examination (general) (routine) without abnormal findings: Secondary | ICD-10-CM

## 2017-09-17 DIAGNOSIS — Z1239 Encounter for other screening for malignant neoplasm of breast: Secondary | ICD-10-CM

## 2017-09-17 DIAGNOSIS — Z6841 Body Mass Index (BMI) 40.0 and over, adult: Secondary | ICD-10-CM | POA: Diagnosis not present

## 2017-09-17 DIAGNOSIS — Z1329 Encounter for screening for other suspected endocrine disorder: Secondary | ICD-10-CM | POA: Diagnosis not present

## 2017-09-17 DIAGNOSIS — Z113 Encounter for screening for infections with a predominantly sexual mode of transmission: Secondary | ICD-10-CM

## 2017-09-17 MED ORDER — NORGESTIMATE-ETH ESTRADIOL 0.25-35 MG-MCG PO TABS: 1.0000 | ORAL_TABLET | Freq: Every day | ORAL | 11 refills | Status: DC

## 2017-09-17 MED ORDER — PHENTERMINE HCL 37.5 MG PO TABS: 37.5000 mg | ORAL_TABLET | Freq: Every day | ORAL | 0 refills | Status: DC

## 2017-09-17 NOTE — Progress Notes (Signed)
Gynecology Annual Exam   PCP: [facility], [place]  Chief Complaint:  Chief Complaint  Patient presents with  . Gynecologic Exam  . discuss weight loss    History of Present Illness: Patient is a 27 y.o. G0P0000 presents for annual exam. The patient has no complaints today.   LMP: Patient's last menstrual period was 09/11/2017 (exact date). Average Interval: regular, 28 days Duration of flow: 4 days Heavy Menses: no Clots: no Intermenstrual Bleeding: no Postcoital Bleeding: no Dysmenorrhea: no  She is having regular menses but these occur on week 1 of her next pill pack on the [place].    The patient is sexually active. She currently uses OCP (estrogen/progesterone) for contraception. She denies dyspareunia.  The patient does perform self breast exams.  There is no notable family history of breast or ovarian cancer in her family.  The patient wears seatbelts: yes.   The patient has regular exercise: not asked.    The patient denies current symptoms of depression.     Patientis a 27 y.o. G0P0000 female, who presents for the evaluation of weight gain. She has gained 10 pounds primarily over 12 months. The patient states the following issues have contributed to her weight problem: decreased time for physical activity.  The patient has no additional symptoms. The patient specifically denies memory loss, muscle weakness, excessive thirst, and polyuria. Weight related co-morbidities include none. The patient's past medical history is notable for none.  Review of Systems: Review of Systems  Constitutional: Negative for chills and fever.  HENT: Negative for congestion.   Respiratory: Negative for cough and shortness of breath.   Cardiovascular: Negative for chest pain and palpitations.  Gastrointestinal: Negative for abdominal pain, constipation, diarrhea, heartburn, nausea and vomiting.  Genitourinary: Negative for dysuria, frequency and urgency.  Skin: Negative for  itching and rash.  Neurological: Negative for dizziness and headaches.  Endo/Heme/Allergies: Negative for polydipsia.  Psychiatric/Behavioral: Negative for depression.    Past Medical History:  Past Medical History:  Diagnosis Date  . Oligomenorrhea     Past Surgical History:  Past Surgical History:  Procedure Laterality Date  . TONSILLECTOMY      Gynecologic History:  Patient's last menstrual period was 09/11/2017 (exact date). Contraception: OCP (estrogen/progesterone) Last Pap: Results were 03/04/2015: no abnormalities   Obstetric History: G0P0000  Family History:  Family History  Problem Relation Age of Onset  . Prostate cancer Father 39  . Breast cancer Maternal Grandmother 46    Social History:  Social History   Socioeconomic History  . Marital status: Single    Spouse name: Not on file  . Number of children: Not on file  . Years of education: Not on file  . Highest education level: Not on file  Occupational History  . Not on file  Social Needs  . Financial resource strain: Not on file  . Food insecurity:    Worry: Not on file    Inability: Not on file  . Transportation needs:    Medical: Not on file    Non-medical: Not on file  Tobacco Use  . Smoking status: Never Smoker  . Smokeless tobacco: Never Used  Substance and Sexual Activity  . Alcohol use: No  . Drug use: No  . Sexual activity: Yes    Birth control/protection: Pill  Lifestyle  . Physical activity:    Days per week: Not on file    Minutes per session: Not on file  . Stress: Not on file  Relationships  . Social connections:    Talks on phone: Not on file    Gets together: Not on file    Attends religious service: Not on file    Active member of club or organization: Not on file    Attends meetings of clubs or organizations: Not on file    Relationship status: Not on file  . Intimate partner violence:    Fear of current or ex partner: Not on file    Emotionally abused: Not on file      Physically abused: Not on file    Forced sexual activity: Not on file  Other Topics Concern  . Not on file  Social History Narrative  . Not on file    Allergies:  No Known Allergies  Medications: Prior to Admission medications   Medication Sig Start Date End Date Taking? Authorizing Provider  Norethin Ace-Eth Estrad-FE (TAYTULLA) 1-20 MG-MCG(24) CAPS Take 1 tablet by mouth daily. 12/04/16  Yes Vena Austria, MD    Physical Exam Vitals: Blood pressure 124/76, pulse (!) 103, height 5\' 1"  (1.549 m), weight 252 lb (114.3 kg), last menstrual period 09/11/2017. Body mass index is 47.61 kg/m. 10lbs weight gain in the past year  General: NAD HEENT: normocephalic, anicteric Thyroid: no enlargement, no palpable nodules Pulmonary: No increased work of breathing, CTAB Cardiovascular: RRR, distal pulses 2+ Breast: Breast symmetrical, no tenderness, no palpable nodules or masses, no skin or nipple retraction present, no nipple discharge.  No axillary or supraclavicular lymphadenopathy. Abdomen: NABS, soft, non-tender, non-distended.  Umbilicus without lesions.  No hepatomegaly, splenomegaly or masses palpable. No evidence of hernia  Genitourinary:  External: Normal external female genitalia.  Normal urethral meatus, normal Bartholin's and Skene's glands.    Vagina: Normal vaginal mucosa, no evidence of prolapse.    Cervix: Grossly normal in appearance, no bleeding  Uterus: Non-enlarged, mobile, normal contour.  No CMT  Adnexa: ovaries non-enlarged, no adnexal masses  Rectal: deferred  Lymphatic: no evidence of inguinal lymphadenopathy Extremities: no edema, erythema, or tenderness Neurologic: Grossly intact Psychiatric: mood appropriate, affect full  Female chaperone present for pelvic and breast  portions of the physical exam    Assessment: 27 y.o. G0P0000 routine annual exam  Plan: Problem List Items Addressed This Visit    None    Visit Diagnoses    Encounter for  gynecological examination without abnormal finding    -  Primary   Relevant Orders   GC/Chlamydia Probe Amp([facility])   Thyroid Panel With TSH (Completed)   HEP, RPR, HIV Panel (Completed)   Hemoglobin A1c (Completed)   Breast screening       Class 3 severe obesity without serious comorbidity with body mass index (BMI) of 45.0 to 49.9 in adult, unspecified obesity type (HCC)       Relevant Medications   phentermine (ADIPEX-P) 37.5 MG tablet   Other Relevant Orders   Thyroid Panel With TSH (Completed)   Hemoglobin A1c (Completed)   Thyroid disorder screening       Relevant Orders   Thyroid Panel With TSH (Completed)   Diabetes mellitus screening       Relevant Orders   Hemoglobin A1c (Completed)   Routine screening for STI (sexually transmitted infection)       Relevant Orders   GC/Chlamydia Probe Amp([facility])   HEP, RPR, HIV Panel (Completed)     Annual Exam  1) STI screening  wasoffered and accepted  2)  ASCCP guidelines and rational discussed.  Patient opts for every  3 years screening interval  3) Contraception - the patient is currently using  OCP (estrogen/progesterone).  She is happy with her current form of contraception and plans to continue - change to sprintec for more consistent withdrawal bleeds  4) Routine healthcare maintenance including cholesterol, diabetes screening discussed managed by PCP  5) Return in about 4 weeks (around 10/15/2017) for medication follow up.   Obesity  1) 1500 Calorie ADA Diet  2) Patient education given regarding appropriate lifestyle changes for weight loss including: regular physical activity, healthy coping strategies, caloric restriction and healthy eating patterns.  3) Patient will be started on weight loss medication. The risks and benefits and side effects of medication, such as Adipex (Phenteramine) ,  Tenuate (Diethylproprion), Belviq (lorcarsin), Contrave (buproprion/naltrexone), Qsymia (phentermine/topiramate), and Saxenda  (liraglutide) is discussed. The pros and cons of suppressing appetite and boosting metabolism is discussed. Risks of tolerence and addiction is discussed for selected agents discussed. Use of medicine will ne short term, such as 3-4 months at a time followed by a period of time off of the medicine to avoid these risks and side effects for Adipex, Qsymia, and Tenuate discussed. Pt to call with any negative side effects and agrees to keep follow up appts.  4) Comorbidity Screening - hypothyroidism screening, diabetes, and hyperlipidemia screening offered  5) Encouraged weekly weight monitorig to track progress and sample 1 week food diary  6) Contraception - discussed that all weight loss drugs fall in to pregnancy category X, patient currently has reliable contraception in the form of OCP  7) 15 minutes face-to-face; counseling/coordination of care > 50 percent of visit  8) Follow up in 4 weeks to assess response     Vena Austria, MD, [facility], [facility] 09/17/2017, 1:31 PM

## 2017-09-17 NOTE — Patient Instructions (Signed)
  FAST FACTS . Body Mass Index (BMI) is one measurement that your doctor may use to discuss your weight . BMI is an estimate of body fat. Individuals with a BMI of 25.0-29.9 are considered overweight. Those with a BMI above 30.0 are considered obese . Obesity is a risk factor for many cancers, especially endometrial cancer. In fact, if you are obese, your risk for endometrial cancer may be 10 times higher. . Obesity may affect how your cancer is treated (surgery, chemotherapy, and/or radiation). . If you are overweight or obese, ask your doctor for information about diet and exercise programs.  EXERCISE Here is a list of resources for exercise recommendations and programs that can help you get started. Be sure to look for exercise programs and classes in your neighborhood to get personal support.  American Cancer Society (ACS): Eat Healthy and Get Active www.cancer.org/healthy/eathealthygetactive/ The site provides details about the importance of exercise in cancer prevention as well as resources providing exercise guidelines and tools to set goals and manage physical activity  American Council on Exercise (ACE): Get Fit www.acefitness.org/acefit  This site is full of fitness programs including personalized training workouts and a library of exercise programs. Links to local exercise trainers are provided.  American Heart Association: Getting Healthy - Physical Activity www.heart.org/HEARTORG/GettingHealthy/PhysicalActivity/Physical-Activity_UCM_001080_SubHomePage.jsp This site provides the American Heart Association guidelines for physical activity, tips for getting started and tips for [place] term success.  

## 2017-09-18 LAB — THYROID PANEL WITH TSH
Free Thyroxine Index: 2.1 (ref 1.2–4.9)
T3 Uptake Ratio: 23 % — ABNORMAL LOW (ref 24–39)
T4, Total: 9.1 ug/dL (ref 4.5–12.0)
TSH: 1.5 u[IU]/mL (ref 0.450–4.500)

## 2017-09-18 LAB — HEP, RPR, HIV PANEL
HEP B S AG: NEGATIVE
HIV Screen 4th Generation wRfx: NONREACTIVE
RPR: NONREACTIVE

## 2017-09-18 LAB — HEMOGLOBIN A1C
ESTIMATED AVERAGE GLUCOSE: 111 mg/dL
HEMOGLOBIN A1C: 5.5 % (ref 4.8–5.6)

## 2017-09-19 LAB — GC/CHLAMYDIA PROBE AMP
CHLAMYDIA, DNA PROBE: NEGATIVE
Neisseria gonorrhoeae by PCR: NEGATIVE

## 2017-10-15 ENCOUNTER — Ambulatory Visit (INDEPENDENT_AMBULATORY_CARE_PROVIDER_SITE_OTHER): Payer: Managed Care, Other (non HMO) | Admitting: Obstetrics and Gynecology

## 2017-10-15 ENCOUNTER — Encounter: Payer: Self-pay | Admitting: Obstetrics and Gynecology

## 2017-10-15 VITALS — BP 120/88 | HR 124 | Ht 61.0 in | Wt 245.0 lb

## 2017-10-15 DIAGNOSIS — Z6841 Body Mass Index (BMI) 40.0 and over, adult: Secondary | ICD-10-CM

## 2017-10-15 MED ORDER — PHENTERMINE HCL 37.5 MG PO TABS: 37.5000 mg | ORAL_TABLET | Freq: Every day | ORAL | 0 refills | Status: DC

## 2017-10-15 NOTE — Progress Notes (Signed)
Gynecology Office Visit  Chief Complaint:  Chief Complaint  Patient presents with  . Follow-up    Weight check    History of Present Illness: Patientis a 27 y.o. G0P0000 female, who presents for the evaluation of the desire to lose weight. She has lost 7 pounds 1 months. The patient states the following symptoms since starting her weight loss therapy: appetite suppression, energy, and weight loss.  The patient also reports no other ill effects. The patient specifically denies heart palpitations, anxiety, and insomnia.    Review of Systems: 10 point review of systems negative unless otherwise noted in HPI  Past Medical History:  Past Medical History:  Diagnosis Date  . Oligomenorrhea     Past Surgical History:  Past Surgical History:  Procedure Laterality Date  . TONSILLECTOMY      Gynecologic History: Patient's last menstrual period was 10/14/2017 (exact date).  Obstetric History: G0P0000  Family History:  Family History  Problem Relation Age of Onset  . Prostate cancer Father 109  . Breast cancer Maternal Grandmother 25    Social History:  Social History   Socioeconomic History  . Marital status: Single    Spouse name: Not on file  . Number of children: Not on file  . Years of education: Not on file  . Highest education level: Not on file  Occupational History  . Not on file  Social Needs  . Financial resource strain: Not on file  . Food insecurity:    Worry: Not on file    Inability: Not on file  . Transportation needs:    Medical: Not on file    Non-medical: Not on file  Tobacco Use  . Smoking status: Never Smoker  . Smokeless tobacco: Never Used  Substance and Sexual Activity  . Alcohol use: No  . Drug use: No  . Sexual activity: Yes    Birth control/protection: Pill  Lifestyle  . Physical activity:    Days per week: Not on file    Minutes per session: Not on file  . Stress: Not on file  Relationships  . Social connections:    Talks on  phone: Not on file    Gets together: Not on file    Attends religious service: Not on file    Active member of club or organization: Not on file    Attends meetings of clubs or organizations: Not on file    Relationship status: Not on file  . Intimate partner violence:    Fear of current or ex partner: Not on file    Emotionally abused: Not on file    Physically abused: Not on file    Forced sexual activity: Not on file  Other Topics Concern  . Not on file  Social History Narrative  . Not on file    Allergies:  No Known Allergies  Medications: Prior to Admission medications   Medication Sig Start Date End Date Taking? Authorizing Provider  norgestimate-ethinyl estradiol (ORTHO-CYCLEN,SPRINTEC,PREVIFEM) 0.25-35 MG-MCG tablet Take 1 tablet by mouth daily. 09/17/17  Yes Vena Austria, MD  phentermine (ADIPEX-P) 37.5 MG tablet Take 1 tablet (37.5 mg total) by mouth daily before breakfast. 09/17/17  Yes Vena Austria, MD    Physical Exam Blood pressure 120/88, pulse (!) 124, height 5\' 1"  (1.549 m), weight 245 lb (111.1 kg), last menstrual period 10/14/2017. Body mass index is 46.29 kg/m.  Wt Readings from Last 3 Encounters:  10/15/17 245 lb (111.1 kg)  09/17/17 252 lb (114.3  kg)  10/06/16 241 lb (109.3 kg)    General: NAD HEENT: normocephalic, anicteric Thyroid: no enlargement Pulmonary: no increased work of breathing Neurologic: Grossly intact Psychiatric: mood appropriate, affect full  Assessment: 27 y.o. G0P0000  Medical weight loss follow up  Plan: Problem List Items Addressed This Visit    None    Visit Diagnoses    Class 3 severe obesity without serious comorbidity with body mass index (BMI) of 45.0 to 49.9 in adult, unspecified obesity type (HCC)    -  Primary   Relevant Medications   phentermine (ADIPEX-P) 37.5 MG tablet      1) 1500 Calorie ADA Diet  2) Patient education given regarding appropriate lifestyle changes for weight loss including:  regular physical activity, healthy coping strategies, caloric restriction and healthy eating patterns.  3) Patient will be started on weight loss medication. The risks and benefits and side effects of medication, such as Adipex (Phenteramine) ,  Tenuate (Diethylproprion), Belviq (lorcarsin), Contrave (buproprion/naltrexone), Qsymia (phentermine/topiramate), and Saxenda (liraglutide) is discussed. The pros and cons of suppressing appetite and boosting metabolism is discussed. Risks of tolerence and addiction is discussed for selected agents discussed. Use of medicine will ne short term, such as 3-4 months at a time followed by a period of time off of the medicine to avoid these risks and side effects for Adipex, Qsymia, and Tenuate discussed. Pt to call with any negative side effects and agrees to keep follow up appts.  4) Patient to take medication, with the benefits of appetite suppression and metabolism boost d/w pt, along with the side effects and risk factors of [place] term use that will be avoided with our use of short bursts of therapy. Rx provided.    5) 15 minutes face-to-face; with counseling/coordination of care > 50 percent of visit related to obesity and ongoing management/treatment   6)  Return in about 4 weeks (around 11/12/2017) for Medication follow up.    Vena Austria, MD, [facility], [facility] 10/15/2017, 4:30 PM

## 2017-10-25 ENCOUNTER — Other Ambulatory Visit: Payer: Self-pay | Admitting: Obstetrics and Gynecology

## 2017-10-25 ENCOUNTER — Telehealth: Payer: Self-pay

## 2017-10-25 MED ORDER — NORETHIN ACE-ETH ESTRAD-FE 1-20 MG-MCG(24) PO CAPS: 1.0000 | ORAL_CAPSULE | Freq: Every day | ORAL | 3 refills | Status: DC

## 2017-10-25 NOTE — Telephone Encounter (Signed)
Rx has been sent in. 

## 2017-10-25 NOTE — Telephone Encounter (Signed)
Pt states she was recently switched to a different OCP, but she does not like it & is requesting to switch back to [facility] & needs a rx sent in. Cb#[phone] or [phone]

## 2017-10-25 NOTE — Telephone Encounter (Signed)
Attempted to notify pt. No Answer. VM Box full. Unable to leave a message.

## 2017-11-12 ENCOUNTER — Ambulatory Visit (INDEPENDENT_AMBULATORY_CARE_PROVIDER_SITE_OTHER): Payer: Managed Care, Other (non HMO) | Admitting: Obstetrics and Gynecology

## 2017-11-12 ENCOUNTER — Encounter: Payer: Self-pay | Admitting: Obstetrics and Gynecology

## 2017-11-12 VITALS — BP 122/78 | HR 106 | Wt 245.0 lb

## 2017-11-12 DIAGNOSIS — Z6841 Body Mass Index (BMI) 40.0 and over, adult: Secondary | ICD-10-CM

## 2017-11-12 MED ORDER — PHENTERMINE HCL 37.5 MG PO TABS: 37.5000 mg | ORAL_TABLET | Freq: Every day | ORAL | 0 refills | Status: DC

## 2017-11-12 MED ORDER — METFORMIN HCL 500 MG PO TABS: 500.0000 mg | ORAL_TABLET | Freq: Two times a day (BID) | ORAL | 0 refills | Status: DC

## 2017-11-12 NOTE — Progress Notes (Signed)
Gynecology Office Visit  Chief Complaint:  Chief Complaint  Patient presents with  . Med follow up    History of Present Illness: Patientis a 27 y.o. G0P0000 female, who presents for the evaluation of the desire to lose weight. She has lost 0 pounds last 1 month, 2 month total weight loss of 7lbs. The patient states the following symptoms since starting her weight loss therapy: appetite suppression, energy, and weight loss.  The patient also reports no other ill effects. The patient specifically denies heart palpitations, anxiety, and insomnia.    Review of Systems: 10 point review of systems negative unless otherwise noted in HPI  Past Medical History:  Past Medical History:  Diagnosis Date  . Oligomenorrhea     Past Surgical History:  Past Surgical History:  Procedure Laterality Date  . TONSILLECTOMY      Gynecologic History: Patient's last menstrual period was 11/09/2017 (exact date).  Obstetric History: G0P0000  Family History:  Family History  Problem Relation Age of Onset  . Prostate cancer Father 34  . Breast cancer Maternal Grandmother 3    Social History:  Social History   Socioeconomic History  . Marital status: Single    Spouse name: Not on file  . Number of children: Not on file  . Years of education: Not on file  . Highest education level: Not on file  Occupational History  . Not on file  Social Needs  . Financial resource strain: Not on file  . Food insecurity:    Worry: Not on file    Inability: Not on file  . Transportation needs:    Medical: Not on file    Non-medical: Not on file  Tobacco Use  . Smoking status: Never Smoker  . Smokeless tobacco: Never Used  Substance and Sexual Activity  . Alcohol use: No  . Drug use: No  . Sexual activity: Yes    Birth control/protection: Pill  Lifestyle  . Physical activity:    Days per week: Not on file    Minutes per session: Not on file  . Stress: Not on file  Relationships  . Social  connections:    Talks on phone: Not on file    Gets together: Not on file    Attends religious service: Not on file    Active member of club or organization: Not on file    Attends meetings of clubs or organizations: Not on file    Relationship status: Not on file  . Intimate partner violence:    Fear of current or ex partner: Not on file    Emotionally abused: Not on file    Physically abused: Not on file    Forced sexual activity: Not on file  Other Topics Concern  . Not on file  Social History Narrative  . Not on file    Allergies:  No Known Allergies  Medications: Prior to Admission medications   Medication Sig Start Date End Date Taking? Authorizing Provider  Norethin Ace-Eth Estrad-FE (TAYTULLA) 1-20 MG-MCG(24) CAPS Take 1 tablet by mouth daily. 10/25/17  Yes Vena Austria, MD  phentermine (ADIPEX-P) 37.5 MG tablet Take 1 tablet (37.5 mg total) by mouth daily before breakfast. 10/15/17  Yes Vena Austria, MD    Physical Exam Blood pressure 122/78, pulse (!) 106, weight 245 lb (111.1 kg), last menstrual period 11/09/2017. Wt Readings from Last 3 Encounters:  11/12/17 245 lb (111.1 kg)  10/15/17 245 lb (111.1 kg)  09/17/17 252 lb (114.3  kg)  Body mass index is 46.29 kg/m.   General: NAD HEENT: normocephalic, anicteric Thyroid: no enlargement Pulmonary: no increased work of breathing Neurologic: Grossly intact Psychiatric: mood appropriate, affect full  Assessment: 27 y.o. G0P0000  Plan: Problem List Items Addressed This Visit    None    Visit Diagnoses    Class 3 severe obesity without serious comorbidity with body mass index (BMI) of 45.0 to 49.9 in adult, unspecified obesity type (HCC)    -  Primary   Relevant Medications   metFORMIN (GLUCOPHAGE) 500 MG tablet   phentermine (ADIPEX-P) 37.5 MG tablet      1) 1500 Calorie ADA Diet  2) Patient education given regarding appropriate lifestyle changes for weight loss including: regular physical  activity, healthy coping strategies, caloric restriction and healthy eating patterns.  3) Patient will be started on weight loss medication. The risks and benefits and side effects of medication, such as Adipex (Phenteramine) ,  Tenuate (Diethylproprion), Belviq (lorcarsin), Contrave (buproprion/naltrexone), Qsymia (phentermine/topiramate), and Saxenda (liraglutide) is discussed. The pros and cons of suppressing appetite and boosting metabolism is discussed. Risks of tolerence and addiction is discussed for selected agents discussed. Use of medicine will ne short term, such as 3-4 months at a time followed by a period of time off of the medicine to avoid these risks and side effects for Adipex, Qsymia, and Tenuate discussed. Pt to call with any negative side effects and agrees to keep follow up appts. - add metformin to phentermine given suspicion for possible underlying PCOS/metabolic syndrome  4) Patient to take medication, with the benefits of appetite suppression and metabolism boost d/w pt, along with the side effects and risk factors of [place] term use that will be avoided with our use of short bursts of therapy. Rx provided.    5) 15 minutes face-to-face; with counseling/coordination of care > 50 percent of visit related to obesity and ongoing management/treatment   6)  Return in about 4 weeks (around 12/10/2017) for wt check.    Vena Austria, MD, [facility], [facility] 11/13/2017, 9:05 AM

## 2017-12-21 ENCOUNTER — Ambulatory Visit: Payer: Managed Care, Other (non HMO) | Admitting: Obstetrics and Gynecology

## 2018-01-08 ENCOUNTER — Ambulatory Visit (INDEPENDENT_AMBULATORY_CARE_PROVIDER_SITE_OTHER): Payer: Managed Care, Other (non HMO) | Admitting: Obstetrics and Gynecology

## 2018-01-08 ENCOUNTER — Encounter: Payer: Self-pay | Admitting: Obstetrics and Gynecology

## 2018-01-08 VITALS — BP 142/80 | HR 113 | Wt 242.0 lb

## 2018-01-08 DIAGNOSIS — Z6841 Body Mass Index (BMI) 40.0 and over, adult: Secondary | ICD-10-CM | POA: Diagnosis not present

## 2018-01-08 MED ORDER — METFORMIN HCL 500 MG PO TABS: 500.0000 mg | ORAL_TABLET | Freq: Two times a day (BID) | ORAL | 3 refills | Status: DC

## 2018-01-08 NOTE — Progress Notes (Signed)
Gynecology Office Visit  Chief Complaint:  Chief Complaint  Patient presents with  . Follow-up    Weight check    History of Present Illness: Patientis a 28 y.o. G0P0000 female, who presents for the evaluation of the desire to lose weight. She has lost 3 pounds 1 months. The patient states the following symptoms since starting her weight loss therapy: appetite suppression, energy, and weight loss.  The patient also reports no other ill effects. The patient specifically denies heart palpitations, anxiety, and insomnia.    Review of Systems: 10 point review of systems negative unless otherwise noted in HPI  Past Medical History:  Past Medical History:  Diagnosis Date  . Oligomenorrhea     Past Surgical History:  Past Surgical History:  Procedure Laterality Date  . TONSILLECTOMY      Gynecologic History: Patient's last menstrual period was 11/09/2017.  Obstetric History: G0P0000  Family History:  Family History  Problem Relation Age of Onset  . Prostate cancer Father 57  . Breast cancer Maternal Grandmother 50    Social History:  Social History   Socioeconomic History  . Marital status: Single    Spouse name: Not on file  . Number of children: Not on file  . Years of education: Not on file  . Highest education level: Not on file  Occupational History  . Not on file  Social Needs  . Financial resource strain: Not on file  . Food insecurity:    Worry: Not on file    Inability: Not on file  . Transportation needs:    Medical: Not on file    Non-medical: Not on file  Tobacco Use  . Smoking status: Never Smoker  . Smokeless tobacco: Never Used  Substance and Sexual Activity  . Alcohol use: No  . Drug use: No  . Sexual activity: Yes    Birth control/protection: Pill  Lifestyle  . Physical activity:    Days per week: Not on file    Minutes per session: Not on file  . Stress: Not on file  Relationships  . Social connections:    Talks on phone: Not  on file    Gets together: Not on file    Attends religious service: Not on file    Active member of club or organization: Not on file    Attends meetings of clubs or organizations: Not on file    Relationship status: Not on file  . Intimate partner violence:    Fear of current or ex partner: Not on file    Emotionally abused: Not on file    Physically abused: Not on file    Forced sexual activity: Not on file  Other Topics Concern  . Not on file  Social History Narrative  . Not on file    Allergies:  No Known Allergies  Medications: Prior to Admission medications   Medication Sig Start Date End Date Taking? Authorizing Provider  Norethin Ace-Eth Estrad-FE (TAYTULLA) 1-20 MG-MCG(24) CAPS Take 1 tablet by mouth daily. 10/25/17  Yes Vena Austria, MD  phentermine (ADIPEX-P) 37.5 MG tablet Take 1 tablet (37.5 mg total) by mouth daily before breakfast. 11/12/17  Yes Vena Austria, MD  metFORMIN (GLUCOPHAGE) 500 MG tablet Take 1 tablet (500 mg total) by mouth 2 (two) times daily with a meal. Patient not taking: Reported on 01/08/2018 11/12/17   Vena Austria, MD    Physical Exam Blood pressure (!) 142/80, pulse (!) 113, weight 242 lb (109.8 kg),  last menstrual period 11/09/2017. Body mass index is 45.73 kg/m.  Wt Readings from Last 3 Encounters:  01/08/18 242 lb (109.8 kg)  11/12/17 245 lb (111.1 kg)  10/15/17 245 lb (111.1 kg)    General: NAD HEENT: normocephalic, anicteric Thyroid: no enlargement Pulmonary: no increased work of breathing Neurologic: Grossly intact Psychiatric: mood appropriate, affect full  Assessment: 28 y.o. G0P0000 No problem-specific Assessment & Plan notes found for this encounter.   Plan: Problem List Items Addressed This Visit    None    Visit Diagnoses    Class 3 severe obesity without serious comorbidity with body mass index (BMI) of 45.0 to 49.9 in adult, unspecified obesity type (HCC)    -  Primary   Relevant Medications    metFORMIN (GLUCOPHAGE) 500 MG tablet      1) 1500 Calorie ADA Diet  2) Patient education given regarding appropriate lifestyle changes for weight loss including: regular physical activity, healthy coping strategies, caloric restriction and healthy eating patterns.  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    Vena Austria, MD, [facility], [facility]

## 2018-02-01 ENCOUNTER — Encounter: Payer: Self-pay | Admitting: Obstetrics and Gynecology

## 2018-02-01 ENCOUNTER — Ambulatory Visit (INDEPENDENT_AMBULATORY_CARE_PROVIDER_SITE_OTHER): Payer: Managed Care, Other (non HMO) | Admitting: Obstetrics and Gynecology

## 2018-02-01 VITALS — BP 138/78 | HR 121 | Wt 242.0 lb

## 2018-02-01 DIAGNOSIS — Z113 Encounter for screening for infections with a predominantly sexual mode of transmission: Secondary | ICD-10-CM

## 2018-02-01 DIAGNOSIS — N760 Acute vaginitis: Secondary | ICD-10-CM

## 2018-02-01 DIAGNOSIS — B3731 Acute candidiasis of vulva and vagina: Secondary | ICD-10-CM

## 2018-02-01 DIAGNOSIS — B373 Candidiasis of vulva and vagina: Secondary | ICD-10-CM

## 2018-02-01 MED ORDER — TERCONAZOLE 0.4 % VA CREA: 1.0000 | TOPICAL_CREAM | Freq: Every day | VAGINAL | 1 refills | Status: DC

## 2018-02-04 NOTE — Progress Notes (Signed)
Obstetrics & Gynecology Office Visit   Chief Complaint:  Chief Complaint  Patient presents with  . Vaginal Itching    STD testing    History of Present Illness: [name] is a 28 y.o. G0P0000 who LMP was Patient's last menstrual period was 01/14/2018 (exact date)., presents today for a problem visit.   Patient complains of an abnormal vaginal discharge for 1 months. Discharge described as: white and thick. Vaginal symptoms include local irritation.   Menstrual pattern: She had been bleeding regularly. Contraception: OCP (estrogen/progesterone).  She denies recent antibiotic exposure, admits to changes in soaps, detergents coinciding with the onset of her symptoms.  She has previously self treated or been under treatment by another provider for these symptoms. Has been treated with diflucan several times with brief improvement in symptoms then symptom relapse.    Review of Systems: Review of Systems  Constitutional: Negative.   Genitourinary: Negative.   Skin: Positive for itching.   Past Medical History:  Past Medical History:  Diagnosis Date  . Oligomenorrhea     Past Surgical History:  Past Surgical History:  Procedure Laterality Date  . TONSILLECTOMY      Gynecologic History: Patient's last menstrual period was 01/14/2018 (exact date).  Obstetric History: G0P0000  Family History:  Family History  Problem Relation Age of Onset  . Prostate cancer Father 64  . Breast cancer Maternal Grandmother 26    Social History:  Social History   Socioeconomic History  . Marital status: Single    Spouse name: Not on file  . Number of children: Not on file  . Years of education: Not on file  . Highest education level: Not on file  Occupational History  . Not on file  Social Needs  . Financial resource strain: Not on file  . Food insecurity:    Worry: Not on file    Inability: Not on file  . Transportation needs:    Medical: Not on file    Non-medical: Not  on file  Tobacco Use  . Smoking status: Never Smoker  . Smokeless tobacco: Never Used  Substance and Sexual Activity  . Alcohol use: No  . Drug use: No  . Sexual activity: Yes    Birth control/protection: Pill  Lifestyle  . Physical activity:    Days per week: Not on file    Minutes per session: Not on file  . Stress: Not on file  Relationships  . Social connections:    Talks on phone: Not on file    Gets together: Not on file    Attends religious service: Not on file    Active member of club or organization: Not on file    Attends meetings of clubs or organizations: Not on file    Relationship status: Not on file  . Intimate partner violence:    Fear of current or ex partner: Not on file    Emotionally abused: Not on file    Physically abused: Not on file    Forced sexual activity: Not on file  Other Topics Concern  . Not on file  Social History Narrative  . Not on file    Allergies:  No Known Allergies  Medications: Prior to Admission medications   Medication Sig Start Date End Date Taking? Authorizing Provider  fluconazole (DIFLUCAN) 150 MG tablet 1 TAB BY MOUTH ONCE AS A SINGLE DOSE, MAY REPEAT AFTER A WEEK 01/17/18  Yes [provider]  metFORMIN (GLUCOPHAGE) 500  MG tablet Take 1 tablet (500 mg total) by mouth 2 (two) times daily with a meal. 01/08/18  Yes Vena Austria, MD  Norethin Ace-Eth Estrad-FE (TAYTULLA) 1-20 MG-MCG(24) CAPS Take 1 tablet by mouth daily. 10/25/17  Yes Vena Austria, MD  phentermine (ADIPEX-P) 37.5 MG tablet Take 1 tablet (37.5 mg total) by mouth daily before breakfast. Patient not taking: Reported on 02/01/2018 11/12/17   Vena Austria, MD  terconazole (TERAZOL 7) 0.4 % vaginal cream Place 1 applicator vaginally at bedtime. 02/01/18   Vena Austria, MD    Physical Exam Vitals:  Vitals:   02/01/18 1127  BP: 138/78  Pulse: (!) 121   Patient's last menstrual period was 01/14/2018 (exact date).  General: NAD HEENT:  normocephalic, anicteric Pulmonary: No increased work of breathing  External: Normal external female genitalia.  Normal urethral meatus, normal  Bartholin's and Skene's glands.    Vagina: Normal vaginal mucosa, no evidence of prolapse.    Cervix: Grossly normal in appearance, no bleeding  Uterus: Non-enlarged, mobile, normal contour.  No CMT  Adnexa: ovaries non-enlarged, no adnexal masses  Rectal: deferred  Lymphatic: no evidence of inguinal lymphadenopathy Extremities: no edema, erythema, or tenderness Neurologic: Grossly intact Psychiatric: mood appropriate, affect full  Female chaperone present for pelvic  portions of the physical exam  Wet Prep: PH: 4.5 Clue Cells: Negative Fungal elements: rare budding yeast forms Trichomonas: Negative   Assessment: 28 y.o. G0P0000 candida vaginitis and STI screening  Plan: Problem List Items Addressed This Visit    None    Visit Diagnoses    Acute vaginitis    -  Primary   Relevant Orders   NuSwab VG+, Candida 6sp     1) Risk factors for bacterial vaginosis and candida infections discussed.  We discussed normal vaginal flora/microbiome.  Any factors that may alter the microbiome increase the risk of these opportunistic infections.  These include changes in pH, antibiotic exposures, diabetes, wet bathing suits etc.  We discussed that treatment is aimed at eradicating abnormal bacterial overgrowth and or yeast.  There may be some role for vaginal probiotics in restoring normal vaginal flora.   - accepts GC/CT cultures - declines additional STI screening - Terazole 7 cream - HgbA1C 5.5 on 09/17/2017, HIV negative 09/17/2017  2) Return in about 4 weeks (around 03/01/2018) for medication follow up.    Vena Austria, MD, [facility], [facility]

## 2018-02-06 LAB — NUSWAB VG+, CANDIDA 6SP
C PARAPSILOSIS/TROPICALIS: NEGATIVE
CANDIDA ALBICANS, NAA: NEGATIVE
CANDIDA GLABRATA, NAA: NEGATIVE
Candida krusei, NAA: NEGATIVE
Candida lusitaniae, NAA: NEGATIVE
Chlamydia trachomatis, NAA: NEGATIVE
Neisseria gonorrhoeae, NAA: NEGATIVE
Trich vag by NAA: NEGATIVE

## 2018-03-04 ENCOUNTER — Ambulatory Visit: Payer: Managed Care, Other (non HMO) | Admitting: Obstetrics and Gynecology

## 2018-04-09 ENCOUNTER — Ambulatory Visit: Payer: Managed Care, Other (non HMO) | Admitting: Obstetrics and Gynecology

## 2018-04-10 ENCOUNTER — Other Ambulatory Visit: Payer: Self-pay

## 2018-04-10 ENCOUNTER — Ambulatory Visit (INDEPENDENT_AMBULATORY_CARE_PROVIDER_SITE_OTHER): Payer: Managed Care, Other (non HMO) | Admitting: Obstetrics and Gynecology

## 2018-04-10 ENCOUNTER — Encounter: Payer: Self-pay | Admitting: Obstetrics and Gynecology

## 2018-04-10 VITALS — Ht 61.0 in | Wt 240.0 lb

## 2018-04-10 DIAGNOSIS — Z6841 Body Mass Index (BMI) 40.0 and over, adult: Secondary | ICD-10-CM

## 2018-04-10 MED ORDER — PHENTERMINE HCL 37.5 MG PO TABS: 37.5000 mg | ORAL_TABLET | Freq: Every day | ORAL | 0 refills | Status: DC

## 2018-04-10 NOTE — Progress Notes (Addendum)
I connected with Giana N Backlund on 04/23/18 at  3:10 PM EDT by telephone and verified that I am speaking with the correct person using two identifiers.   I discussed the limitations, risks, security and privacy concerns of performing an evaluation and management service by telephone and the availability of in person appointments. I also discussed with the patient that there may be a patient responsible charge related to this service. The patient expressed understanding and agreed to proceed.  The patient was at home I spoke with the patient from my workstation phone The names of people involved in this encounter were: Malli N Lopezmartinez , and Vena Austria   Gynecology Office Visit  Chief Complaint:  Chief Complaint  Patient presents with  . Weight Check    History of Present Illness: Patientis a 28 y.o. G0P0000 female, who presents for the evaluation of the desire to lose weight. She has lost 2 pounds 3 months. The patient states has tolerated metformin well without significant GI upset.  Occasionally forgets a dose.  The patient also reports no other ill effects.    Review of Systems: 10 point review of systems negative unless otherwise noted in HPI  Past Medical History:  Past Medical History:  Diagnosis Date  . Oligomenorrhea     Past Surgical History:  Past Surgical History:  Procedure Laterality Date  . TONSILLECTOMY      Gynecologic History: Patient's last menstrual period was 03/30/2018 (exact date).  Obstetric History: G0P0000  Family History:  Family History  Problem Relation Age of Onset  . Prostate cancer Father 22  . Breast cancer Maternal Grandmother 36    Social History:  Social History   Socioeconomic History  . Marital status: Single    Spouse name: Not on file  . Number of children: Not on file  . Years of education: Not on file  . Highest education level: Not on file  Occupational History  . Not on file  Social Needs  . Financial  resource strain: Not on file  . Food insecurity:    Worry: Not on file    Inability: Not on file  . Transportation needs:    Medical: Not on file    Non-medical: Not on file  Tobacco Use  . Smoking status: Never Smoker  . Smokeless tobacco: Never Used  Substance and Sexual Activity  . Alcohol use: No  . Drug use: No  . Sexual activity: Yes    Birth control/protection: Pill  Lifestyle  . Physical activity:    Days per week: Not on file    Minutes per session: Not on file  . Stress: Not on file  Relationships  . Social connections:    Talks on phone: Not on file    Gets together: Not on file    Attends religious service: Not on file    Active member of club or organization: Not on file    Attends meetings of clubs or organizations: Not on file    Relationship status: Not on file  . Intimate partner violence:    Fear of current or ex partner: Not on file    Emotionally abused: Not on file    Physically abused: Not on file    Forced sexual activity: Not on file  Other Topics Concern  . Not on file  Social History Narrative  . Not on file    Allergies:  No Known Allergies  Medications: Prior to Admission medications   Medication Sig  Start Date End Date Taking? Authorizing Provider  metFORMIN (GLUCOPHAGE) 500 MG tablet Take 1 tablet (500 mg total) by mouth 2 (two) times daily with a meal. 01/08/18  Yes Vena Austria, MD  Norethin Ace-Eth Estrad-FE (TAYTULLA) 1-20 MG-MCG(24) CAPS Take 1 tablet by mouth daily. 10/25/17  Yes Vena Austria, MD  terconazole (TERAZOL 7) 0.4 % vaginal cream Place 1 applicator vaginally at bedtime. 02/01/18  Yes Vena Austria, MD    Physical Exam Height 5\' 1"  (1.549 m), weight 240 lb (108.9 kg), last menstrual period 03/30/2018. Wt Readings from Last 3 Encounters:  04/10/18 240 lb (108.9 kg)  02/01/18 242 lb (109.8 kg)  01/08/18 242 lb (109.8 kg)  Body mass index is 45.35 kg/m.   General: NAD HEENT: normocephalic, anicteric  Thyroid: no enlargement Pulmonary: no increased work of breathing Neurologic: Grossly intact Psychiatric: mood appropriate, affect full  Assessment: 28 y.o. G0P0000  Plan: Problem List Items Addressed This Visit    None    Visit Diagnoses    Class 3 severe obesity without serious comorbidity with body mass index (BMI) of 40.0 to 44.9 in adult, unspecified obesity type (HCC)    -  Primary   Relevant Medications   phentermine (ADIPEX-P) 37.5 MG tablet      1) 1500 Calorie ADA Diet  2) Patient to take medication, with the benefits of appetite suppression and metabolism boost d/w pt, along with the side effects and risk factors of [place] term use that will be avoided with our use of short bursts of therapy. Rx provided restart phentermine   3) Phone time 6 minutes 52 seconds  4)  Return in about 4 weeks (around 05/08/2018) for medication follow up phone visit.    Vena Austria, MD, [facility], [facility] 04/10/2018, 4:06 PM

## 2018-04-18 ENCOUNTER — Telehealth: Payer: Self-pay

## 2018-04-18 NOTE — Telephone Encounter (Signed)
Pt called triage stating she is having a raw,burning, and itching feeling in her vagina and it hurts. No odor, and no discharge. I have advised pt to be seen for this sense she is asking for a creme but she is not sure of what it going on down there.

## 2018-04-22 ENCOUNTER — Encounter: Payer: Self-pay | Admitting: Obstetrics and Gynecology

## 2018-04-22 ENCOUNTER — Other Ambulatory Visit: Payer: Self-pay

## 2018-04-22 ENCOUNTER — Other Ambulatory Visit (HOSPITAL_COMMUNITY)
Admission: RE | Admit: 2018-04-22 | Discharge: 2018-04-22 | Disposition: A | Discharge status: Home / Self Care | Payer: Managed Care, Other (non HMO) | Source: Ambulatory Visit | Attending: Obstetrics and Gynecology | Admitting: Obstetrics and Gynecology

## 2018-04-22 ENCOUNTER — Ambulatory Visit (INDEPENDENT_AMBULATORY_CARE_PROVIDER_SITE_OTHER): Payer: Managed Care, Other (non HMO) | Admitting: Obstetrics and Gynecology

## 2018-04-22 VITALS — BP 128/78 | Ht 60.0 in | Wt 239.0 lb

## 2018-04-22 DIAGNOSIS — N760 Acute vaginitis: Secondary | ICD-10-CM

## 2018-04-22 DIAGNOSIS — Z124 Encounter for screening for malignant neoplasm of cervix: Secondary | ICD-10-CM | POA: Diagnosis present

## 2018-04-22 MED ORDER — NYSTATIN 100000 UNIT/GM EX CREA: 1.0000 "application " | TOPICAL_CREAM | Freq: Two times a day (BID) | CUTANEOUS | 1 refills | Status: DC

## 2018-04-22 MED ORDER — TERCONAZOLE 0.4 % VA CREA: 1.0000 | TOPICAL_CREAM | Freq: Every day | VAGINAL | 1 refills | Status: DC

## 2018-04-22 NOTE — Progress Notes (Signed)
Obstetrics & Gynecology Office Visit   Chief Complaint:  Chief Complaint  Patient presents with  . vaginal rash    itching/burning    History of Present Illness: [name] is a 28 y.o. G0P0000 who LMP was Patient's last menstrual period was 03/30/2018 (exact date)., presents today for a problem visit.   Patient complains of an abnormal vaginal discharge for 1 weeks. Discharge described as: white and thick. Vaginal symptoms include local irritation.   Other associated symptoms: none She had been bleeding regularly. Contraception: OCP (estrogen/progesterone).  She denies recent antibiotic exposure, denies changes in soaps, detergents coinciding with the onset of her symptoms.  She has not previously self treated or been under treatment by another provider for these symptoms.   Review of Systems: Review of Systems  Constitutional: Negative.   Genitourinary: Positive for dysuria. Negative for flank pain, frequency, hematuria and urgency.  Skin: Positive for itching. Negative for rash.   Past Medical History:  Past Medical History:  Diagnosis Date  . Oligomenorrhea     Past Surgical History:  Past Surgical History:  Procedure Laterality Date  . TONSILLECTOMY      Gynecologic History: Patient's last menstrual period was 03/30/2018 (exact date).  Obstetric History: G0P0000  Family History:  Family History  Problem Relation Age of Onset  . Prostate cancer Father 67  . Breast cancer Maternal Grandmother 38    Social History:  Social History   Socioeconomic History  . Marital status: Single    Spouse name: Not on file  . Number of children: Not on file  . Years of education: Not on file  . Highest education level: Not on file  Occupational History  . Not on file  Social Needs  . Financial resource strain: Not on file  . Food insecurity:    Worry: Not on file    Inability: Not on file  . Transportation needs:    Medical: Not on file    Non-medical: Not  on file  Tobacco Use  . Smoking status: Never Smoker  . Smokeless tobacco: Never Used  Substance and Sexual Activity  . Alcohol use: No  . Drug use: No  . Sexual activity: Yes    Birth control/protection: Pill  Lifestyle  . Physical activity:    Days per week: Not on file    Minutes per session: Not on file  . Stress: Not on file  Relationships  . Social connections:    Talks on phone: Not on file    Gets together: Not on file    Attends religious service: Not on file    Active member of club or organization: Not on file    Attends meetings of clubs or organizations: Not on file    Relationship status: Not on file  . Intimate partner violence:    Fear of current or ex partner: Not on file    Emotionally abused: Not on file    Physically abused: Not on file    Forced sexual activity: Not on file  Other Topics Concern  . Not on file  Social History Narrative  . Not on file    Allergies:  No Known Allergies  Medications: Prior to Admission medications   Medication Sig Start Date End Date Taking? Authorizing Provider  metFORMIN (GLUCOPHAGE) 500 MG tablet Take 1 tablet (500 mg total) by mouth 2 (two) times daily with a meal. 01/08/18  Yes Vena Austria, MD  Norethin Ace-Eth Estrad-FE (TAYTULLA) 1-20  MG-MCG(24) CAPS Take 1 tablet by mouth daily. 10/25/17  Yes Vena Austria, MD  phentermine (ADIPEX-P) 37.5 MG tablet Take 1 tablet (37.5 mg total) by mouth daily before breakfast. 04/10/18  Yes Vena Austria, MD  nystatin cream (MYCOSTATIN) Apply 1 application topically 2 (two) times daily. 04/22/18   Vena Austria, MD  terconazole (TERAZOL 7) 0.4 % vaginal cream Place 1 applicator vaginally at bedtime. 04/22/18   Vena Austria, MD    Physical Exam Vitals:  Vitals:   04/22/18 1529  BP: 128/78   Patient's last menstrual period was 03/30/2018 (exact date).  General: NAD, well nourished, appears stated age HEENT: normocephalic, anicteric Pulmonary: No  increased work of breathing Genitourinary:  External: Normal external female genitalia.  There is inflammation and erythema of the right labia majora.  Normal urethral meatus, normal  Bartholin's and Skene's glands.    Vagina: Normal vaginal mucosa, no evidence of prolapse.    Cervix: Grossly normal in appearance, no bleeding  Uterus: Non-enlarged, mobile, normal contour.  No CMT  Adnexa: ovaries non-enlarged, no adnexal masses  Rectal: deferred  Lymphatic: no evidence of inguinal lymphadenopathy Extremities: no edema, erythema, or tenderness Neurologic: Grossly intact Psychiatric: mood appropriate, affect full  Female chaperone present for pelvic  portions of the physical exam  Assessment: 29 y.o. G0P0000 vaginal candida  Plan: Problem List Items Addressed This Visit    None    Visit Diagnoses    Recurrent vaginitis    -  Primary   Relevant Orders   NuSwab VG+, Candida 6sp   Screening for malignant neoplasm of cervix       Relevant Orders   Cytology - PAP     1) Risk factors for bacterial vaginosis and candida infections discussed.  We discussed normal vaginal flora/microbiome.  Any factors that may alter the microbiome increase the risk of these opportunistic infections.  These include changes in pH, antibiotic exposures, diabetes, wet bathing suits etc.  We discussed that treatment is aimed at eradicating abnormal bacterial overgrowth and or yeast.  There may be some role for vaginal probiotics in restoring normal vaginal flora.   - pap and nuswab obtained - Rx nystatin for external symptoms, Terazol for vaginal symptoms  2) Return in about 1 year (around 04/22/2019) for annual.   Vena Austria, MD, [facility], [facility] 04/22/2018, 3:48 PM

## 2018-04-24 LAB — CYTOLOGY - PAP: Diagnosis: NEGATIVE

## 2018-05-03 LAB — NUSWAB VG+, CANDIDA 6SP
C PARAPSILOSIS/TROPICALIS: NEGATIVE
Candida albicans, NAA: POSITIVE — AB
Candida glabrata, NAA: NEGATIVE
Candida krusei, NAA: NEGATIVE
Candida lusitaniae, NAA: NEGATIVE
Chlamydia trachomatis, NAA: NEGATIVE
Neisseria gonorrhoeae, NAA: NEGATIVE
Trich vag by NAA: NEGATIVE

## 2018-07-19 ENCOUNTER — Telehealth: Payer: Self-pay

## 2018-07-19 NOTE — Telephone Encounter (Signed)
Please advise 

## 2018-07-19 NOTE — Telephone Encounter (Signed)
Pt called after hour nurse 07/19/18 at 7:25am; has questions regarding her birth control pills and her ins.  [phone]  Pharm informed pt her ins no longer covers [place].  Pt called ins; they will cover:  [place]/Eth, Noreth/Ethin; Junel FE 24, Nore/Eth/Fer, Dros/Ethest.  Adv will send this to [employer].  Pharm is correct in chart.

## 2018-08-06 ENCOUNTER — Other Ambulatory Visit: Payer: Self-pay | Admitting: Obstetrics and Gynecology

## 2018-08-06 MED ORDER — NORETHIN ACE-ETH ESTRAD-FE 1-20 MG-MCG PO TABS: 1.0000 | ORAL_TABLET | Freq: Every day | ORAL | 11 refills | Status: DC

## 2018-08-06 NOTE — Telephone Encounter (Signed)
Pt aware.

## 2018-08-06 NOTE — Telephone Encounter (Signed)
Has been switched to junel

## 2019-08-06 ENCOUNTER — Emergency Department: Payer: Managed Care, Other (non HMO)

## 2019-08-06 ENCOUNTER — Other Ambulatory Visit: Payer: Self-pay

## 2019-08-06 ENCOUNTER — Inpatient Hospital Stay
Admission: EM | Admit: 2019-08-06 | Discharge: 2019-08-10 | DRG: 177 | Disposition: A | Discharge status: Home / Self Care | Payer: Managed Care, Other (non HMO) | Attending: Internal Medicine | Admitting: Internal Medicine

## 2019-08-06 DIAGNOSIS — U071 COVID-19: Principal | ICD-10-CM | POA: Diagnosis present

## 2019-08-06 DIAGNOSIS — Z8042 Family history of malignant neoplasm of prostate: Secondary | ICD-10-CM

## 2019-08-06 DIAGNOSIS — Z6841 Body Mass Index (BMI) 40.0 and over, adult: Secondary | ICD-10-CM

## 2019-08-06 DIAGNOSIS — E118 Type 2 diabetes mellitus with unspecified complications: Secondary | ICD-10-CM | POA: Diagnosis present

## 2019-08-06 DIAGNOSIS — Z7984 Long term (current) use of oral hypoglycemic drugs: Secondary | ICD-10-CM | POA: Diagnosis not present

## 2019-08-06 DIAGNOSIS — J1282 Pneumonia due to coronavirus disease 2019: Secondary | ICD-10-CM | POA: Diagnosis present

## 2019-08-06 DIAGNOSIS — E66813 Obesity, class 3: Secondary | ICD-10-CM | POA: Diagnosis present

## 2019-08-06 LAB — CBC
HCT: 45.1 % (ref 36.0–46.0)
Hemoglobin: 14.6 g/dL (ref 12.0–15.0)
MCH: 28.6 pg (ref 26.0–34.0)
MCHC: 32.4 g/dL (ref 30.0–36.0)
MCV: 88.4 fL (ref 80.0–100.0)
Platelets: 208 10*3/uL (ref 150–400)
RBC: 5.1 MIL/uL (ref 3.87–5.11)
RDW: 13 % (ref 11.5–15.5)
WBC: 5.2 10*3/uL (ref 4.0–10.5)
nRBC: 0 % (ref 0.0–0.2)

## 2019-08-06 LAB — BASIC METABOLIC PANEL
Anion gap: 12 (ref 5–15)
BUN: 11 mg/dL (ref 6–20)
CO2: 24 mmol/L (ref 22–32)
Calcium: 8.6 mg/dL — ABNORMAL LOW (ref 8.9–10.3)
Chloride: 104 mmol/L (ref 98–111)
Creatinine, Ser: 0.93 mg/dL (ref 0.44–1.00)
GFR calc Af Amer: >60 mL/min (ref >60)
GFR calc non Af Amer: >60 mL/min (ref >60)
Glucose, Bld: 118 mg/dL — ABNORMAL HIGH (ref 70–99)
Potassium: 4.3 mmol/L (ref 3.5–5.1)
Sodium: 140 mmol/L (ref 135–145)

## 2019-08-06 LAB — TROPONIN I (HIGH SENSITIVITY): Troponin I (High Sensitivity): 5 ng/L (ref <18)

## 2019-08-06 LAB — POCT PREGNANCY, URINE: Preg Test, Ur: NEGATIVE

## 2019-08-06 LAB — ABO/RH: ABO/RH(D): O POS

## 2019-08-06 LAB — GLUCOSE, CAPILLARY: Glucose-Capillary: 109 mg/dL — ABNORMAL HIGH (ref 70–99)

## 2019-08-06 IMAGING — CR DG CHEST 2V
2 series · 2 of 2 positions shown · non-contrast
Comparison: None

CLINICAL DATA: Chest pain and shortness of breath four days.
[03] virus infection.

EXAM:
CHEST - 2 VIEW

[chest lat]
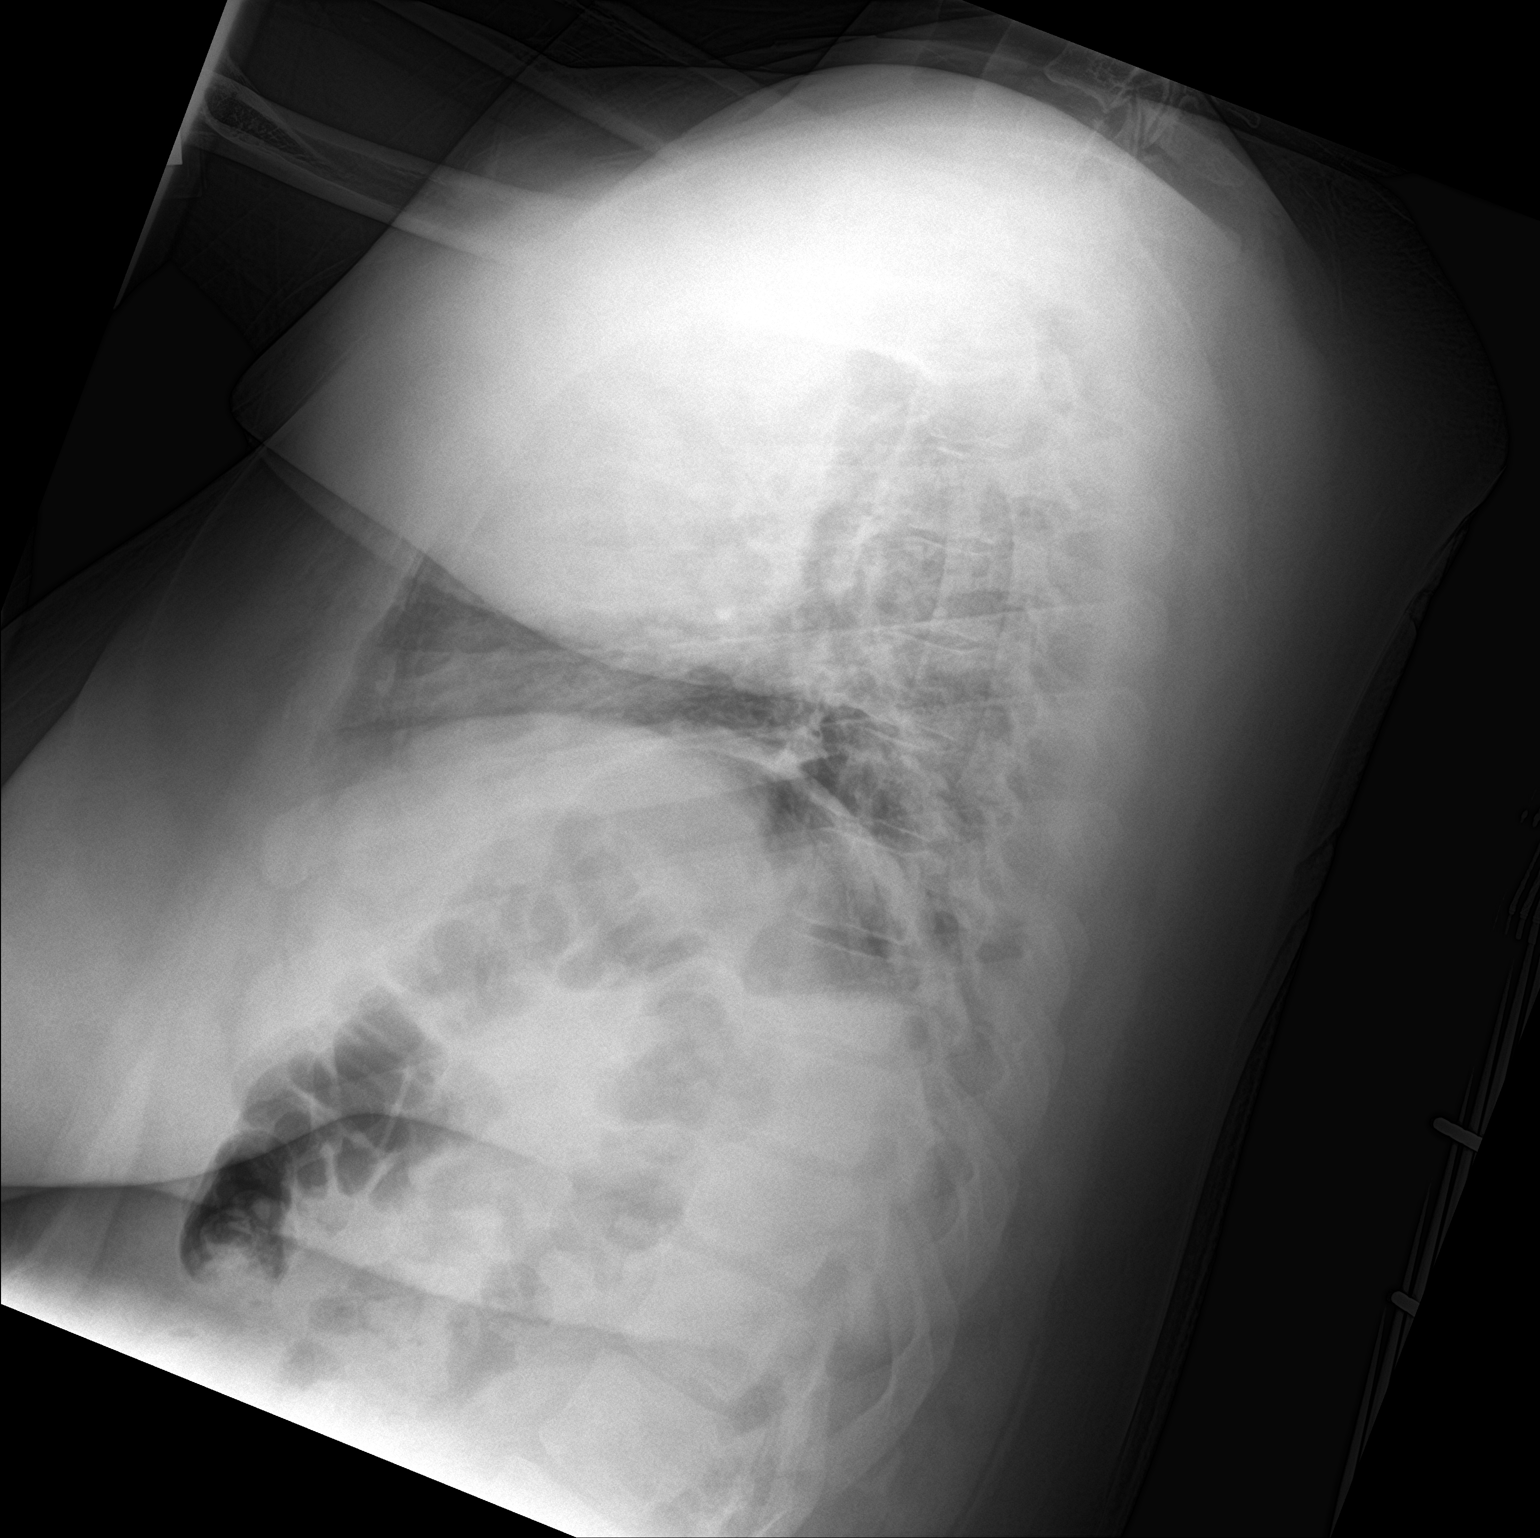

[chest ap]
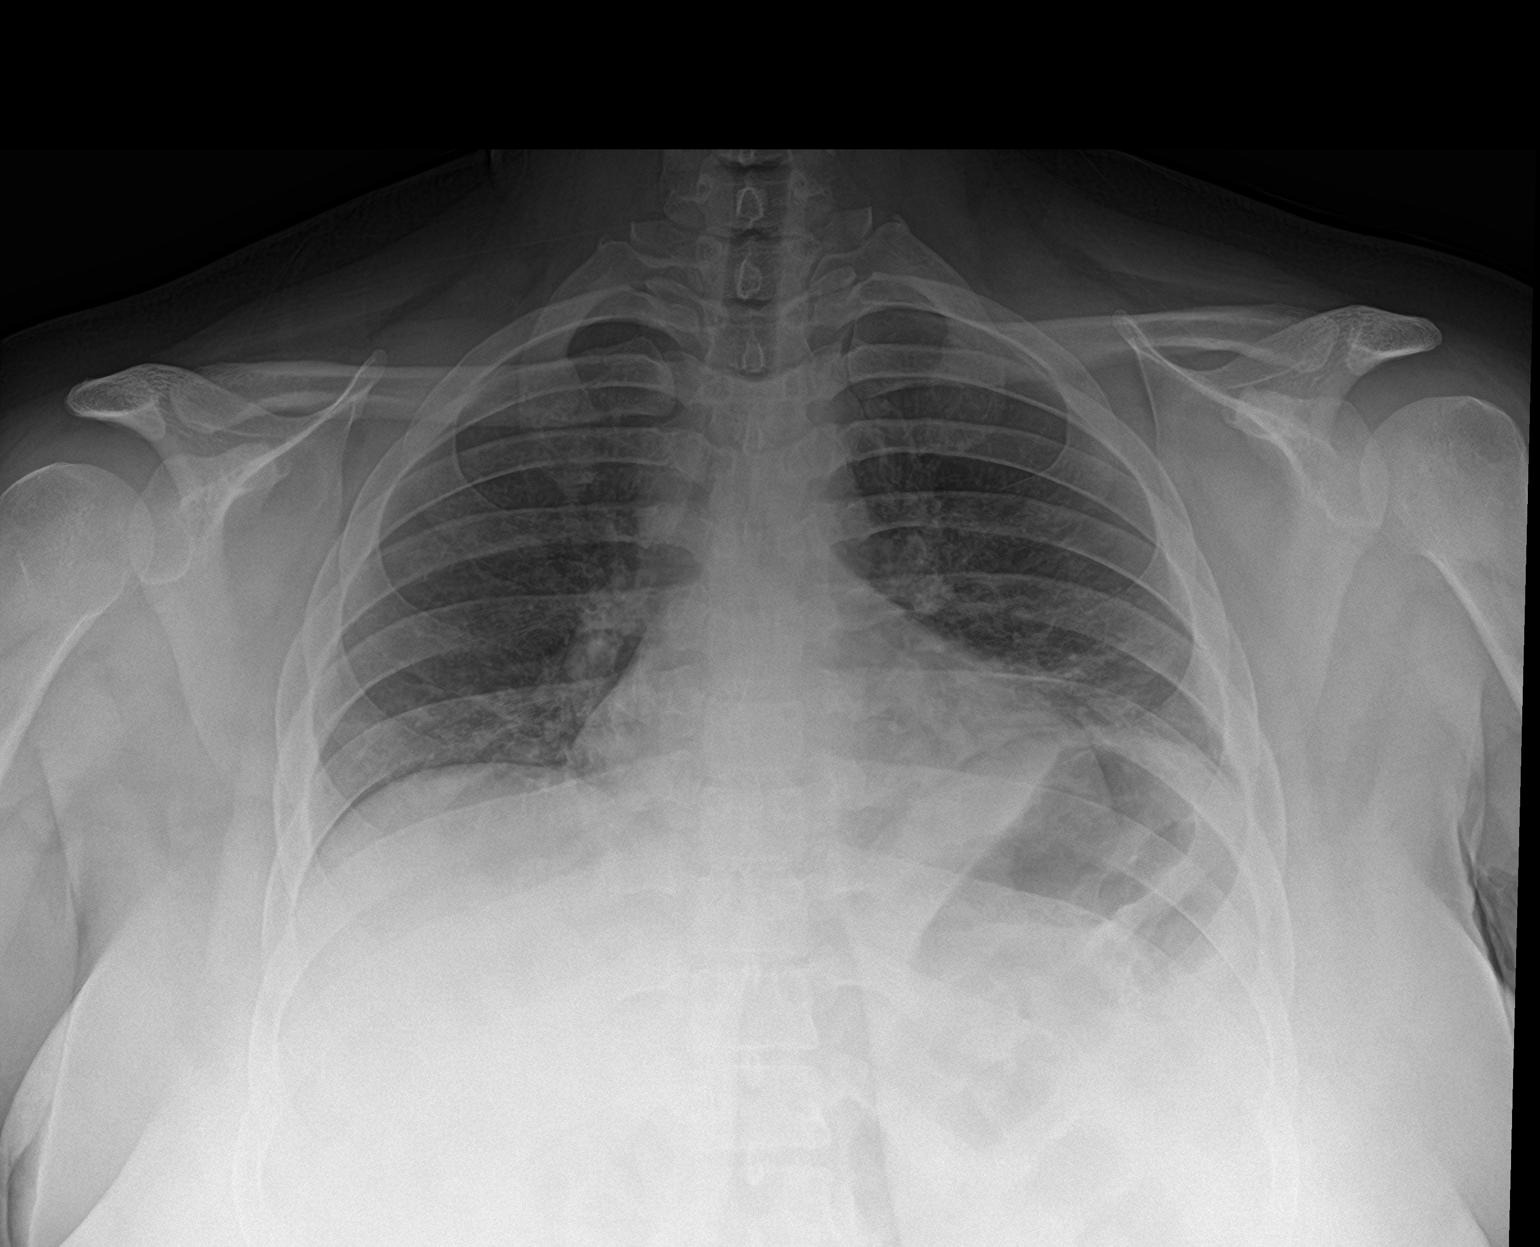

[2 of 2 positions shown; findings below may reference images not displayed]

FINDINGS: Very low lung volumes are seen with bibasilar atelectasis versus
infiltrates. No evidence of pleural effusion. Heart size is within
normal limits allowing for low lung volumes.
IMPRESSION: Very low lung volumes, with bibasilar atelectasis versus
infiltrates.

## 2019-08-06 MED ORDER — GUAIFENESIN-DM 100-10 MG/5ML PO SYRP
10.0000 mL | ORAL_SOLUTION | ORAL | Status: DC | PRN
  Filled 2019-08-06: qty 10

## 2019-08-06 MED ORDER — INSULIN DETEMIR 100 UNIT/ML ~~LOC~~ SOLN
0.1500 [IU]/kg | Freq: Two times a day (BID) | SUBCUTANEOUS | Status: DC
  Administered 2019-08-06: 16 [IU] via SUBCUTANEOUS
  Filled 2019-08-06 (×3): qty 0.16

## 2019-08-06 MED ORDER — IBUPROFEN 600 MG PO TABS
600.0000 mg | ORAL_TABLET | ORAL | Status: AC
  Administered 2019-08-06: 600 mg via ORAL
  Filled 2019-08-06: qty 1

## 2019-08-06 MED ORDER — ENOXAPARIN SODIUM 40 MG/0.4ML ~~LOC~~ SOLN
40.0000 mg | Freq: Two times a day (BID) | SUBCUTANEOUS | Status: DC
  Administered 2019-08-06 – 2019-08-10 (×8): 40 mg via SUBCUTANEOUS
  Filled 2019-08-06 (×8): qty 0.4

## 2019-08-06 MED ORDER — ACETAMINOPHEN 325 MG PO TABS
ORAL_TABLET | ORAL | Status: AC
  Filled 2019-08-06: qty 2

## 2019-08-06 MED ORDER — SODIUM CHLORIDE 0.9% FLUSH: 3.0000 mL | INTRAVENOUS | Status: DC | PRN

## 2019-08-06 MED ORDER — SODIUM CHLORIDE 0.9 % IV BOLUS
1000.0000 mL | Freq: Once | INTRAVENOUS | Status: AC
  Administered 2019-08-06: 1000 mL via INTRAVENOUS

## 2019-08-06 MED ORDER — ADULT MULTIVITAMIN W/MINERALS CH
1.0000 | ORAL_TABLET | Freq: Every day | ORAL | Status: DC
  Administered 2019-08-06 – 2019-08-10 (×5): 1 via ORAL
  Filled 2019-08-06 (×5): qty 1

## 2019-08-06 MED ORDER — SODIUM CHLORIDE 0.9 % IV SOLN: 250.0000 mL | INTRAVENOUS | Status: DC | PRN

## 2019-08-06 MED ORDER — ACETAMINOPHEN 325 MG PO TABS
650.0000 mg | ORAL_TABLET | Freq: Four times a day (QID) | ORAL | Status: DC | PRN
  Filled 2019-08-06: qty 2

## 2019-08-06 MED ORDER — SODIUM CHLORIDE 0.9% FLUSH
3.0000 mL | Freq: Two times a day (BID) | INTRAVENOUS | Status: DC
  Administered 2019-08-06 – 2019-08-10 (×8): 3 mL via INTRAVENOUS

## 2019-08-06 MED ORDER — ONDANSETRON HCL 4 MG/2ML IJ SOLN: 4.0000 mg | Freq: Four times a day (QID) | INTRAMUSCULAR | Status: DC | PRN

## 2019-08-06 MED ORDER — NORETHIN ACE-ETH ESTRAD-FE 1-20 MG-MCG PO TABS: 1.0000 | ORAL_TABLET | Freq: Every day | ORAL | Status: DC

## 2019-08-06 MED ORDER — ONDANSETRON HCL 4 MG PO TABS: 4.0000 mg | ORAL_TABLET | Freq: Four times a day (QID) | ORAL | Status: DC | PRN

## 2019-08-06 MED ORDER — HYDROCOD POLST-CPM POLST ER 10-8 MG/5ML PO SUER: 5.0000 mL | Freq: Two times a day (BID) | ORAL | Status: DC | PRN

## 2019-08-06 MED ORDER — PHENTERMINE HCL 37.5 MG PO TABS: 37.5000 mg | ORAL_TABLET | Freq: Every day | ORAL | Status: DC

## 2019-08-06 MED ORDER — SODIUM CHLORIDE 0.9 % IV SOLN
100.0000 mg | Freq: Every day | INTRAVENOUS | Status: AC
  Administered 2019-08-07 – 2019-08-10 (×4): 100 mg via INTRAVENOUS
  Filled 2019-08-06 (×2): qty 20
  Filled 2019-08-06 (×2): qty 100

## 2019-08-06 MED ORDER — DEXAMETHASONE 4 MG PO TABS
6.0000 mg | ORAL_TABLET | ORAL | Status: DC
  Administered 2019-08-06 – 2019-08-09 (×4): 6 mg via ORAL
  Filled 2019-08-06: qty 2
  Filled 2019-08-06: qty 1.5
  Filled 2019-08-06 (×2): qty 2
  Filled 2019-08-06: qty 1.5

## 2019-08-06 MED ORDER — ACETAMINOPHEN 325 MG PO TABS
650.0000 mg | ORAL_TABLET | Freq: Once | ORAL | Status: AC | PRN
  Administered 2019-08-06: 650 mg via ORAL

## 2019-08-06 MED ORDER — INSULIN ASPART 100 UNIT/ML ~~LOC~~ SOLN: 0.0000 [IU] | Freq: Three times a day (TID) | SUBCUTANEOUS | Status: DC

## 2019-08-06 MED ORDER — SODIUM CHLORIDE 0.9 % IV SOLN
200.0000 mg | Freq: Once | INTRAVENOUS | Status: AC
  Administered 2019-08-06: 200 mg via INTRAVENOUS
  Filled 2019-08-06: qty 200

## 2019-08-06 NOTE — Consult Note (Signed)
Remdesivir - Pharmacy Brief Note   O:  ALT: No recent data CXR: "very low lung volumes, with bibasilar atelectasis versus infiltrates" SpO2: 95-97% on RA   A/P:  08/01/19 SARS-CoV-2 rapid antigen positive  Remdesivir 200 mg IVPB once followed by 100 mg IVPB daily x 4 days.   Tressie Ellis 08/06/2019 2:31 PM

## 2019-08-06 NOTE — ED Triage Notes (Signed)
Reports chest pain to center since Saturday and SOB. Pt COVID positive 7/22. No obvious appearance of SOB but begins to hyperventilate when speaking.

## 2019-08-06 NOTE — H&P (Addendum)
History and Physical    EARLIE ARCIGA ACZ:[id number] DOB: [birth year] DOA: 08/06/2019  PCP: [facility] [place]   Patient coming from: Home  I have personally briefly reviewed patient's old medical records in [facility]  Chief Complaint: Shortness of breath  HPI: Angelica Robles is a 29 y.o. female with medical history significant for morbid obesity who presents to the emergency room for evaluation of shortness of breath.  Patient states that she has been sick for over a week with symptoms which she described as fever, dry cough, runny nose and generalized body aches.  She tested positive for the COVID-19 virus on July 02, 2019.  Over the last 2 days she developed fatigue and worsening shortness of breath and today on admission she developed tightness in her chest.  She has had poor oral intake, lost her sense of smell and taste.  She denies having any nausea, vomiting and abdominal pain or any changes in her bowel habits. Vital signs T-max 103.3, respiratory rate 26 pulse rate 124 blood pressure 135/96.  Pulse oximetry on room air was 97% Chest x-ray reviewed by me shows bibasilar atelectasis/infiltrates Twelve-lead EKG reviewed by me shows sinus tachycardia Patient is unvaccinated  ED Course: Patient is a 29 year old female who presents to the emergency room for evaluation of shortness of breath.  She has been sick for over a week but presented to the emergency room today for evaluation of chest tightness and worsening shortness of breath.  Her COVID-19 PCR test was positive on 08/01/19.  Patient received a liter of IV fluids in the emergency room and is currently on 2 L of oxygen via nasal cannula.  She will be admitted to the hospital for further evaluation.  Review of Systems: As per HPI otherwise 10 point review of systems negative.    Past Medical History:  Diagnosis Date  . Oligomenorrhea     Past Surgical History:  Procedure Laterality Date  . TONSILLECTOMY        reports that she has never smoked. She has never used smokeless tobacco. She reports that she does not drink alcohol and does not use drugs.  No Known Allergies  Family History  Problem Relation Age of Onset  . Prostate cancer Father 19  . Breast cancer Maternal Grandmother 50     Prior to Admission medications   Medication Sig Start Date End Date Taking? Authorizing Provider  metFORMIN (GLUCOPHAGE) 500 MG tablet Take 1 tablet (500 mg total) by mouth 2 (two) times daily with a meal. 01/08/18   Vena Austria, MD  norethindrone-ethinyl estradiol (JUNEL FE 1/20) 1-20 MG-MCG tablet Take 1 tablet by mouth daily. 08/06/18   Vena Austria, MD  nystatin cream (MYCOSTATIN) Apply 1 application topically 2 (two) times daily. 04/22/18   Vena Austria, MD  phentermine (ADIPEX-P) 37.5 MG tablet Take 1 tablet (37.5 mg total) by mouth daily before breakfast. 04/10/18   Vena Austria, MD  terconazole (TERAZOL 7) 0.4 % vaginal cream Place 1 applicator vaginally at bedtime. 04/22/18   Vena Austria, MD    Physical Exam: Vitals:   08/06/19 1340 08/06/19 1400 08/06/19 1430 08/06/19 1531  BP:   118/79   Pulse:   (!) 103   Resp: (!) 22 18 16    Temp: (!) 101.7 F (38.7 C)   99.7 F (37.6 C)  TempSrc: Oral   Oral  SpO2:   95%   Weight:      Height:  Vitals:   08/06/19 1340 08/06/19 1400 08/06/19 1430 08/06/19 1531  BP:   118/79   Pulse:   (!) 103   Resp: (!) 22 18 16    Temp: (!) 101.7 F (38.7 C)   99.7 F (37.6 C)  TempSrc: Oral   Oral  SpO2:   95%   Weight:      Height:        Constitutional: NAD, alert and oriented x 3 Eyes: PERRL, lids and conjunctivae normal ENMT: Mucous membranes are moist.  Neck: normal, supple, no masses, no thyromegaly Respiratory: Bilateral air entry, no wheezing, no crackles. Normal respiratory effort. No accessory muscle use.  Cardiovascular: Regular rate and rhythm, no murmurs / rubs / gallops. No extremity edema. 2+ pedal pulses.  No carotid bruits.  Abdomen: no tenderness, no masses palpated. No hepatosplenomegaly. Bowel sounds positive.  Central adiposity Musculoskeletal: no clubbing / cyanosis. No joint deformity upper and lower extremities.  Skin: no rashes, lesions, ulcers.  Neurologic: No gross focal neurologic deficit. Psychiatric: Normal mood and affect.   Labs on Admission: I have personally reviewed following labs and imaging studies  CBC: Recent Labs  Lab 08/06/19 1215  WBC 5.2  HGB 14.6  HCT 45.1  MCV 88.4  PLT 208   Basic Metabolic Panel: Recent Labs  Lab 08/06/19 1215  NA 140  K 4.3  CL 104  CO2 24  GLUCOSE 118*  BUN 11  CREATININE 0.93  CALCIUM 8.6*   GFR: Estimated Creatinine Clearance: 99.2 mL/min (by C-G formula based on SCr of 0.93 mg/dL). Liver Function Tests: No results for input(s): AST, ALT, ALKPHOS, BILITOT, PROT, ALBUMIN in the last 168 hours. No results for input(s): LIPASE, AMYLASE in the last 168 hours. No results for input(s): AMMONIA in the last 168 hours. Coagulation Profile: No results for input(s): INR, PROTIME in the last 168 hours. Cardiac Enzymes: No results for input(s): CKTOTAL, CKMB, CKMBINDEX, TROPONINI in the last 168 hours. BNP (last 3 results) No results for input(s): PROBNP in the last 8760 hours. HbA1C: No results for input(s): HGBA1C in the last 72 hours. CBG: No results for input(s): GLUCAP in the last 168 hours. Lipid Profile: No results for input(s): CHOL, HDL, LDLCALC, TRIG, CHOLHDL, LDLDIRECT in the last 72 hours. Thyroid Function Tests: No results for input(s): TSH, T4TOTAL, FREET4, T3FREE, THYROIDAB in the last 72 hours. Anemia Panel: No results for input(s): VITAMINB12, FOLATE, FERRITIN, TIBC, IRON, RETICCTPCT in the last 72 hours. Urine analysis:    Component Value Date/Time   COLORURINE RED (A) 09/09/2015 0713   APPEARANCEUR CLOUDY (A) 09/09/2015 0713   LABSPEC 1.032 (H) 09/09/2015 0713   PHURINE  09/09/2015 0713    TEST NOT  REPORTED DUE TO COLOR INTERFERENCE OF URINE PIGMENT   GLUCOSEU (A) 09/09/2015 0713    TEST NOT REPORTED DUE TO COLOR INTERFERENCE OF URINE PIGMENT   HGBUR (A) 09/09/2015 0713    TEST NOT REPORTED DUE TO COLOR INTERFERENCE OF URINE PIGMENT   BILIRUBINUR (A) 09/09/2015 0713    TEST NOT REPORTED DUE TO COLOR INTERFERENCE OF URINE PIGMENT   KETONESUR (A) 09/09/2015 0713    TEST NOT REPORTED DUE TO COLOR INTERFERENCE OF URINE PIGMENT   PROTEINUR (A) 09/09/2015 0713    TEST NOT REPORTED DUE TO COLOR INTERFERENCE OF URINE PIGMENT   NITRITE (A) 09/09/2015 0713    TEST NOT REPORTED DUE TO COLOR INTERFERENCE OF URINE PIGMENT   LEUKOCYTESUR (A) 09/09/2015 0713    TEST NOT REPORTED DUE TO COLOR  INTERFERENCE OF URINE PIGMENT    Radiological Exams on Admission: DG Chest 2 View  Result Date: 08/06/2019 CLINICAL DATA:  Chest pain and shortness of breath four days. COVID-19 virus infection. EXAM: CHEST - 2 VIEW COMPARISON:  None FINDINGS: Very low lung volumes are seen with bibasilar atelectasis versus infiltrates. No evidence of pleural effusion. Heart size is within normal limits allowing for low lung volumes. IMPRESSION: Very low lung volumes, with bibasilar atelectasis versus infiltrates. Electronically Signed   By: Danae Orleans M.D.   On: 08/06/2019 12:52    EKG: Independently reviewed.  Tachycardia  Assessment/Plan Active Problems:   COVID-19   Obesity, Class III, BMI 40-49.9 (morbid obesity) (HCC)   COVID-19 viral infection Patient presents to the emergency room for evaluation of chest tightness and shortness of breath and has had symptoms of an upper respiratory tract infection for over 1 week. Her symptoms started on 07/29/2019 Her COVID-19 PCR test was positive on 08/01/2019 We will start patient on remdesivir per protocol We will also start her on Decadron since she has infiltrates on chest x-ray Continue supportive care with antipyretics and vitamins   Morbid obesity (BMI 43.4  kg/m2) Complicates overall prognosis and care  DVT prophylaxis: Lovenox Code Status: Full code Family Communication: Greater than 50% of time was spent discussing patient's condition and plan of care with her at the bedside.  She verbalizes understanding and agrees with plan. Disposition Plan: Back to previous home environment Consults called: None    Damonique Brunelle MD [facility]     08/06/2019, 3:39 PM

## 2019-08-06 NOTE — ED Provider Notes (Signed)
[facility] Emergency Department Provider Note   ____________________________________________   None    (approximate)  I have reviewed the triage vital signs and the nursing notes.   HISTORY  Chief Complaint Trouble breathing and Covid   HPI Angelica Robles is a 29 y.o. female reports to me about 5 days ago she tested positive for Covid.  She has been sick for perhaps a week to a week and a half of symptoms including cough runny nose body aches and persistent fevers.  She is continued to feel fatigue and increasing shortness of breath over the last 2 days.  Feels a slight sense of tightness in her chest but not really pain.  Reports body aches all over.  No vomiting.  Feeling of dehydration  Denies any chance of pregnancy  Past Medical History:  Diagnosis Date  . Oligomenorrhea     There are no problems to display for this patient.   Past Surgical History:  Procedure Laterality Date  . TONSILLECTOMY      Prior to Admission medications   Medication Sig Start Date End Date Taking? Authorizing Provider  metFORMIN (GLUCOPHAGE) 500 MG tablet Take 1 tablet (500 mg total) by mouth 2 (two) times daily with a meal. 01/08/18   Vena Austria, MD  norethindrone-ethinyl estradiol (JUNEL FE 1/20) 1-20 MG-MCG tablet Take 1 tablet by mouth daily. 08/06/18   Vena Austria, MD  nystatin cream (MYCOSTATIN) Apply 1 application topically 2 (two) times daily. 04/22/18   Vena Austria, MD  phentermine (ADIPEX-P) 37.5 MG tablet Take 1 tablet (37.5 mg total) by mouth daily before breakfast. 04/10/18   Vena Austria, MD  terconazole (TERAZOL 7) 0.4 % vaginal cream Place 1 applicator vaginally at bedtime. 04/22/18   Vena Austria, MD    Allergies Patient has no known allergies.  Family History  Problem Relation Age of Onset  . Prostate cancer Father 82  . Breast cancer Maternal Grandmother 17    Social History Social History   Tobacco Use  .  Smoking status: Never Smoker  . Smokeless tobacco: Never Used  Vaping Use  . Vaping Use: Never used  Substance Use Topics  . Alcohol use: No  . Drug use: No    Review of Systems Constitutional: Fevers chills body aches fatigue Eyes: No visual changes. ENT: Symptoms started as a sore throat about a week and a half ago cardiovascular: Feeling of slight tightness across the chest respiratory: See HPI gastrointestinal: No abdominal pain.   Genitourinary: Negative for dysuria. Musculoskeletal: Muscle aches everywhere Skin: Negative for rash. Neurological: Negative for areas of focal weakness or numbness.    ____________________________________________   PHYSICAL EXAM:  VITAL SIGNS: ED Triage Vitals [08/06/19 1212]  Enc Vitals Group     BP (!) 135/94     Pulse Rate (!) 124     Resp (!) 26     Temp (!) 103.3 F (39.6 C)     Temp Source Oral     SpO2 97 %     Weight 230 lb (104.3 kg)     Height 5\' 1"  (1.549 m)     Head Circumference      Peak Flow      Pain Score 8     Pain Loc      Pain Edu?      Excl. in GC?     Constitutional: Alert and oriented.  Mildly ill-appearing, slightly dyspneic, slightly tachypneic with mild use accessory muscles Eyes: Conjunctivae are normal.  Head: Atraumatic. Nose: No congestion/rhinnorhea. Mouth/Throat: Examination deferred due to masking requirement and positive COVID-19 status Neck: No stridor.  Cardiovascular: Tachycardic rate, regular rhythm. Grossly normal heart sounds.  Good peripheral circulation. Respiratory: Mild tachypnea, mild accessory muscle use.  Lungs are clear bilaterally. Gastrointestinal: Soft and nontender. No distention. Musculoskeletal: No lower extremity tenderness nor edema. Neurologic:  Normal speech and language. No gross focal neurologic deficits are appreciated.  Skin:  Skin is warm, hot to touch and intact. No rash noted. Psychiatric: Mood and affect are normal. Speech and behavior are  normal.  ____________________________________________   LABS (all labs ordered are listed, but only abnormal results are displayed)  Labs Reviewed  BASIC METABOLIC PANEL - Abnormal; Notable for the following components:      Result Value   Glucose, Bld 118 (*)    Calcium 8.6 (*)    All other components within normal limits  CBC  POC URINE PREG, ED  TROPONIN I (HIGH SENSITIVITY)  TROPONIN I (HIGH SENSITIVITY)   ____________________________________________  EKG  Reviewed interpreted by me at 1220 Heart rate 120 QRS 80 QTc 420 Sinus tachycardia with left ventricular hypertrophy suspected ____________________________________________  RADIOLOGY  DG Chest 2 View  Result Date: 08/06/2019 CLINICAL DATA:  Chest pain and shortness of breath four days. COVID-19 virus infection. EXAM: CHEST - 2 VIEW COMPARISON:  None FINDINGS: Very low lung volumes are seen with bibasilar atelectasis versus infiltrates. No evidence of pleural effusion. Heart size is within normal limits allowing for low lung volumes. IMPRESSION: Very low lung volumes, with bibasilar atelectasis versus infiltrates. Electronically Signed   By: Danae Orleans M.D.   On: 08/06/2019 12:52    Chest x-ray reviewed, atelectasis versus infiltrates noted bibasilar ____________________________________________   PROCEDURES  Procedure(s) performed: None  Procedures  Critical Care performed: No  ____________________________________________   INITIAL IMPRESSION / ASSESSMENT AND PLAN / ED COURSE  Pertinent labs & imaging results that were available during my care of the patient were reviewed by me and considered in my medical decision making (see chart for details).   Patient presents for evaluation of shortness of breath increasing myalgias and weakness in the setting of a positive COVID-19 test 5 days ago.  Clinical history and symptomatology certainly seems consistent with COVID-19 and now having dyspnea, she does not have  overt hypoxia but her work of breathing is increased and will place her on oxygen for comfort plan to hydrate her, and given the extent of her symptoms associated dyspnea and accessory muscle use in conjunction with COVID-19 will admit for further care and management.  EKG does not demonstrate acute ischemia.    Further care and treatment discussed with Dr. Joylene Igo of the hospitalist service  Patient understanding agreeable with plan for admission     ____________________________________________   FINAL CLINICAL IMPRESSION(S) / ED DIAGNOSES  Final diagnoses:  COVID-19        Note:  This document was prepared using Dragon voice recognition software and may include unintentional dictation errors       Sharyn Creamer, MD 08/06/19 1416

## 2019-08-06 NOTE — Progress Notes (Signed)
PHARMACIST - PHYSICIAN COMMUNICATION  CONCERNING:  Enoxaparin (Lovenox) for DVT Prophylaxis    RECOMMENDATION: Patient was prescribed enoxaprin 40mg  q24 hours for VTE prophylaxis.   Filed Weights   08/06/19 1212  Weight: 104.3 kg (230 lb)    Body mass index is 43.46 kg/m.  Estimated Creatinine Clearance: 99.2 mL/min (by C-G formula based on SCr of 0.93 mg/dL).   Based on [facility] policy patient is candidate for enoxaparin 40mg  every 12 hour dosing due to BMI being >40.   DESCRIPTION: Pharmacy has adjusted enoxaparin dose per [facility] policy.  Patient is now receiving enoxaparin 40mg  every 12 hours.    Marky Buresh, PharmD Clinical Pharmacist  08/06/2019 2:26 PM

## 2019-08-07 ENCOUNTER — Encounter: Payer: Self-pay | Admitting: Internal Medicine

## 2019-08-07 ENCOUNTER — Other Ambulatory Visit: Payer: Self-pay

## 2019-08-07 LAB — CBC WITH DIFFERENTIAL/PLATELET
Abs Immature Granulocytes: 0.01 10*3/uL (ref 0.00–0.07)
Basophils Absolute: 0 10*3/uL (ref 0.0–0.1)
Basophils Relative: 0 %
Eosinophils Absolute: 0 10*3/uL (ref 0.0–0.5)
Eosinophils Relative: 0 %
HCT: 40.9 % (ref 36.0–46.0)
Hemoglobin: 13 g/dL (ref 12.0–15.0)
Immature Granulocytes: 0 %
Lymphocytes Relative: 35 %
Lymphs Abs: 0.9 10*3/uL (ref 0.7–4.0)
MCH: 28.5 pg (ref 26.0–34.0)
MCHC: 31.8 g/dL (ref 30.0–36.0)
MCV: 89.7 fL (ref 80.0–100.0)
Monocytes Absolute: 0.2 10*3/uL (ref 0.1–1.0)
Monocytes Relative: 8 %
Neutro Abs: 1.4 10*3/uL — ABNORMAL LOW (ref 1.7–7.7)
Neutrophils Relative %: 57 %
Platelets: 205 10*3/uL (ref 150–400)
RBC: 4.56 MIL/uL (ref 3.87–5.11)
RDW: 13.2 % (ref 11.5–15.5)
WBC: 2.4 10*3/uL — ABNORMAL LOW (ref 4.0–10.5)
nRBC: 0 % (ref 0.0–0.2)

## 2019-08-07 LAB — PHOSPHORUS: Phosphorus: 2.9 mg/dL (ref 2.5–4.6)

## 2019-08-07 LAB — HIV ANTIBODY (ROUTINE TESTING W REFLEX): HIV Screen 4th Generation wRfx: NONREACTIVE

## 2019-08-07 LAB — GLUCOSE, CAPILLARY
Glucose-Capillary: 94 mg/dL (ref 70–99)
Glucose-Capillary: 117 mg/dL — ABNORMAL HIGH (ref 70–99)
Glucose-Capillary: 114 mg/dL — ABNORMAL HIGH (ref 70–99)
Glucose-Capillary: 107 mg/dL — ABNORMAL HIGH (ref 70–99)

## 2019-08-07 LAB — FIBRIN DERIVATIVES D-DIMER (ARMC ONLY): Fibrin derivatives D-dimer (ARMC): 1322.47 ng/mL (FEU) — ABNORMAL HIGH (ref 0.00–499.00)

## 2019-08-07 LAB — FERRITIN: Ferritin: 543 ng/mL — ABNORMAL HIGH (ref 11–307)

## 2019-08-07 LAB — MAGNESIUM: Magnesium: 2.3 mg/dL (ref 1.7–2.4)

## 2019-08-07 LAB — C-REACTIVE PROTEIN: CRP: 4.5 mg/dL — ABNORMAL HIGH (ref <1.0)

## 2019-08-07 MED ORDER — ZINC SULFATE 220 (50 ZN) MG PO CAPS
220.0000 mg | ORAL_CAPSULE | Freq: Every day | ORAL | Status: DC
  Administered 2019-08-07 – 2019-08-10 (×4): 220 mg via ORAL
  Filled 2019-08-07 (×4): qty 1

## 2019-08-07 MED ORDER — ASCORBIC ACID 500 MG PO TABS
500.0000 mg | ORAL_TABLET | Freq: Every day | ORAL | Status: DC
  Administered 2019-08-07 – 2019-08-10 (×4): 500 mg via ORAL
  Filled 2019-08-07 (×4): qty 1

## 2019-08-07 NOTE — Plan of Care (Signed)
Alert denies pain. Breathing unlabored denies SOB and chest pain at this time on room air lungs diminished. Independent with mobility, feeding, and ADLs. No skin issues. Bed in low position call bell in reach. continue with current POC will continue to monitor.

## 2019-08-07 NOTE — Progress Notes (Addendum)
1        [facility]   PATIENT NAME: Angelica Robles    MR#:  [id number]  DATE OF BIRTH:  1990/07/11  SUBJECTIVE:  CHIEF COMPLAINT:   Chief Complaint  Patient presents with   Chest Pain  Shortness of breath improving, refusing any kind of insulin  REVIEW OF SYSTEMS:  Review of Systems  Constitutional: Negative for diaphoresis, fever, malaise/fatigue and weight loss.  HENT: Negative for ear discharge, ear pain, hearing loss, nosebleeds, sore throat and tinnitus.   Eyes: Negative for blurred vision and pain.  Respiratory: Positive for shortness of breath. Negative for cough, hemoptysis and wheezing.   Cardiovascular: Negative for chest pain, palpitations, orthopnea and leg swelling.  Gastrointestinal: Negative for abdominal pain, blood in stool, constipation, diarrhea, heartburn, nausea and vomiting.  Genitourinary: Negative for dysuria, frequency and urgency.  Musculoskeletal: Negative for back pain and myalgias.  Skin: Negative for itching and rash.  Neurological: Negative for dizziness, tingling, tremors, focal weakness, seizures, weakness and headaches.  Psychiatric/Behavioral: Negative for depression. The patient is not nervous/anxious.    DRUG ALLERGIES:  No Known Allergies VITALS:  Blood pressure 108/68, pulse 77, temperature 98.6 F (37 C), temperature source Oral, resp. rate 17, height 5\' 1"  (1.549 m), weight 104.3 kg, SpO2 95 %. PHYSICAL EXAMINATION:  Physical Exam HENT:     Head: Normocephalic and atraumatic.  Eyes:     Conjunctiva/sclera: Conjunctivae normal.     Pupils: Pupils are equal, round, and reactive to light.  Neck:     Thyroid: No thyromegaly.     Trachea: No tracheal deviation.  Cardiovascular:     Rate and Rhythm: Normal rate and regular rhythm.     Heart sounds: Normal heart sounds.  Pulmonary:     Effort: Pulmonary effort is normal. No respiratory distress.     Breath sounds: Normal breath sounds. No wheezing.  Chest:      Chest wall: No tenderness.  Abdominal:     General: Bowel sounds are normal. There is no distension.     Palpations: Abdomen is soft.     Tenderness: There is no abdominal tenderness.  Musculoskeletal:        General: Normal range of motion.     Cervical back: Normal range of motion and neck supple.  Skin:    General: Skin is warm and dry.     Findings: No rash.  Neurological:     Mental Status: She is alert and oriented to person, place, and time.     Cranial Nerves: No cranial nerve deficit.    LABORATORY PANEL:  Female CBC Recent Labs  Lab 08/07/19 0333  WBC 2.4*  HGB 13.0  HCT 40.9  PLT 205   ------------------------------------------------------------------------------------------------------------------ Chemistries  Recent Labs  Lab 08/06/19 1215 08/07/19 0333  NA 140  --   K 4.3  --   CL 104  --   CO2 24  --   GLUCOSE 118*  --   BUN 11  --   CREATININE 0.93  --   CALCIUM 8.6*  --   MG  --  2.3   RADIOLOGY:  No results found. ASSESSMENT AND PLAN:  29 year old female admitted for pneumonia due to Covid infection  Pneumonia due to Covid viral infection Continue remdesivir and Decadron day 2/5 Vitamin C and zinc Proning 16+ hours a day when awake and possible On room air now  Morbid obesity Body mass index is 43.46 kg/m. Counseled for weight loss  Status is: Inpatient  Remains inpatient appropriate because:IV treatments appropriate due to intensity of illness or inability to take PO   Dispo: The patient is from: Home              Anticipated d/c is to: Home              Anticipated d/c date is: 3 days              Patient currently is not medically stable to d/c.  Needs 3 more days of IV remdesivir    DVT prophylaxis:            enoxaparin (LOVENOX) injection 40 mg Start: 08/06/19 2000     Family Communication: discussed with patient   All the records are reviewed and case discussed with Care Management/Social Worker. Management  plans discussed with the patient, nursing and they are in agreement.  CODE STATUS: Full Code  TOTAL TIME TAKING CARE OF THIS PATIENT: 25 minutes.   More than 50% of the time was spent in counseling/coordination of care: YES  POSSIBLE D/C IN 3 DAYS, DEPENDING ON CLINICAL CONDITION.   Delfino Lovett M.D on 08/07/2019 at 2:37 PM  [facility]   CC: Primary care physician; [facility], [place]  Note: This dictation was prepared with Dragon dictation along with smaller phrase technology. Any transcriptional errors that result from this process are unintentional.

## 2019-08-08 LAB — CBC WITH DIFFERENTIAL/PLATELET
Abs Immature Granulocytes: 0.01 10*3/uL (ref 0.00–0.07)
Basophils Absolute: 0 10*3/uL (ref 0.0–0.1)
Basophils Relative: 0 %
Eosinophils Absolute: 0 10*3/uL (ref 0.0–0.5)
Eosinophils Relative: 0 %
HCT: 42.3 % (ref 36.0–46.0)
Hemoglobin: 13.6 g/dL (ref 12.0–15.0)
Immature Granulocytes: 0 %
Lymphocytes Relative: 48 %
Lymphs Abs: 1.3 10*3/uL (ref 0.7–4.0)
MCH: 28.6 pg (ref 26.0–34.0)
MCHC: 32.2 g/dL (ref 30.0–36.0)
MCV: 88.9 fL (ref 80.0–100.0)
Monocytes Absolute: 0.2 10*3/uL (ref 0.1–1.0)
Monocytes Relative: 8 %
Neutro Abs: 1.3 10*3/uL — ABNORMAL LOW (ref 1.7–7.7)
Neutrophils Relative %: 44 %
Platelets: 263 10*3/uL (ref 150–400)
RBC: 4.76 MIL/uL (ref 3.87–5.11)
RDW: 13.2 % (ref 11.5–15.5)
Smear Review: NORMAL
WBC: 2.8 10*3/uL — ABNORMAL LOW (ref 4.0–10.5)
nRBC: 0 % (ref 0.0–0.2)

## 2019-08-08 LAB — FERRITIN: Ferritin: 724 ng/mL — ABNORMAL HIGH (ref 11–307)

## 2019-08-08 LAB — GLUCOSE, CAPILLARY
Glucose-Capillary: 114 mg/dL — ABNORMAL HIGH (ref 70–99)
Glucose-Capillary: 120 mg/dL — ABNORMAL HIGH (ref 70–99)
Glucose-Capillary: 96 mg/dL (ref 70–99)
Glucose-Capillary: 101 mg/dL — ABNORMAL HIGH (ref 70–99)

## 2019-08-08 LAB — C-REACTIVE PROTEIN: CRP: 1.1 mg/dL — ABNORMAL HIGH (ref <1.0)

## 2019-08-08 LAB — MAGNESIUM: Magnesium: 2.6 mg/dL — ABNORMAL HIGH (ref 1.7–2.4)

## 2019-08-08 LAB — FIBRIN DERIVATIVES D-DIMER (ARMC ONLY): Fibrin derivatives D-dimer (ARMC): 935.1 ng/mL (FEU) — ABNORMAL HIGH (ref 0.00–499.00)

## 2019-08-08 LAB — PHOSPHORUS: Phosphorus: 4 mg/dL (ref 2.5–4.6)

## 2019-08-08 NOTE — Progress Notes (Signed)
1        [place] at [facility]   PATIENT NAME: Angelica Robles    MR#:  [id number]  DATE OF BIRTH:  [birth year]  SUBJECTIVE:  CHIEF COMPLAINT:   Chief Complaint  Patient presents with  . Chest Pain  Shortness of breath improving, requesting work excuse at discharge as she works at [employer] REVIEW OF SYSTEMS:  Review of Systems  Constitutional: Negative for diaphoresis, fever, malaise/fatigue and weight loss.  HENT: Negative for ear discharge, ear pain, hearing loss, nosebleeds, sore throat and tinnitus.   Eyes: Negative for blurred vision and pain.  Respiratory: Positive for shortness of breath. Negative for cough, hemoptysis and wheezing.   Cardiovascular: Negative for chest pain, palpitations, orthopnea and leg swelling.  Gastrointestinal: Negative for abdominal pain, blood in stool, constipation, diarrhea, heartburn, nausea and vomiting.  Genitourinary: Negative for dysuria, frequency and urgency.  Musculoskeletal: Negative for back pain and myalgias.  Skin: Negative for itching and rash.  Neurological: Negative for dizziness, tingling, tremors, focal weakness, seizures, weakness and headaches.  Psychiatric/Behavioral: Negative for depression. The patient is not nervous/anxious.    DRUG ALLERGIES:  No Known Allergies VITALS:  Blood pressure 114/89, pulse 70, temperature 98.2 F (36.8 C), resp. rate 20, height 5\' 1"  (1.549 m), weight 104.3 kg, SpO2 97 %. PHYSICAL EXAMINATION:  Physical Exam HENT:     Head: Normocephalic and atraumatic.  Eyes:     Conjunctiva/sclera: Conjunctivae normal.     Pupils: Pupils are equal, round, and reactive to light.  Neck:     Thyroid: No thyromegaly.     Trachea: No tracheal deviation.  Cardiovascular:     Rate and Rhythm: Normal rate and regular rhythm.     Heart sounds: Normal heart sounds.  Pulmonary:     Effort: Pulmonary effort is normal. No respiratory distress.     Breath sounds: Normal breath sounds. No wheezing.    Chest:     Chest wall: No tenderness.  Abdominal:     General: Bowel sounds are normal. There is no distension.     Palpations: Abdomen is soft.     Tenderness: There is no abdominal tenderness.  Musculoskeletal:        General: Normal range of motion.     Cervical back: Normal range of motion and neck supple.  Skin:    General: Skin is warm and dry.     Findings: No rash.  Neurological:     Mental Status: She is alert and oriented to person, place, and time.     Cranial Nerves: No cranial nerve deficit.    LABORATORY PANEL:  Female CBC Recent Labs  Lab 08/08/19 0531  WBC 2.8*  HGB 13.6  HCT 42.3  PLT 263   ------------------------------------------------------------------------------------------------------------------ Chemistries  Recent Labs  Lab 08/06/19 1215 08/07/19 0333 08/08/19 0531  NA 140  --   --   K 4.3  --   --   CL 104  --   --   CO2 24  --   --   GLUCOSE 118*  --   --   BUN 11  --   --   CREATININE 0.93  --   --   CALCIUM 8.6*  --   --   MG  --    < > 2.6*   < > = values in this interval not displayed.   RADIOLOGY:  No results found. ASSESSMENT AND PLAN:  29 year old female admitted for pneumonia due to Covid infection  Pneumonia  due to Covid viral infection Continue remdesivir and Decadron day 3/5 Vitamin C and zinc Proning 16+ hours a day when awake and possible On room air now  Morbid obesity Body mass index is 43.46 kg/m. Counseled for weight loss   Requests work excuse at discharge  Status is: Inpatient  Remains inpatient appropriate because:IV treatments appropriate due to intensity of illness or inability to take PO   Dispo: The patient is from: Home              Anticipated d/c is to: Home              Anticipated d/c date is: 2 days              Patient currently is not medically stable to d/c.  Needs 2 more days of IV remdesivir    DVT prophylaxis:            enoxaparin (LOVENOX) injection 40 mg Start: 08/06/19  2000     Family Communication: discussed with patient   All the records are reviewed and case discussed with Care Management/Social Worker. Management plans discussed with the patient, nursing and they are in agreement.  CODE STATUS: Full Code  TOTAL TIME TAKING CARE OF THIS PATIENT: 25 minutes.   More than 50% of the time was spent in counseling/coordination of care: YES  POSSIBLE D/C IN 2 DAYS, DEPENDING ON CLINICAL CONDITION.   Delfino Lovett M.D on 08/08/2019 at 2:15 PM  [facility]   CC: Primary care physician; [facility], [place]  Note: This dictation was prepared with Dragon dictation along with smaller phrase technology. Any transcriptional errors that result from this process are unintentional.

## 2019-08-09 LAB — CBC WITH DIFFERENTIAL/PLATELET
Abs Immature Granulocytes: 0.02 10*3/uL (ref 0.00–0.07)
Basophils Absolute: 0 10*3/uL (ref 0.0–0.1)
Basophils Relative: 0 %
Eosinophils Absolute: 0 10*3/uL (ref 0.0–0.5)
Eosinophils Relative: 0 %
HCT: 40.6 % (ref 36.0–46.0)
Hemoglobin: 13.2 g/dL (ref 12.0–15.0)
Immature Granulocytes: 1 %
Lymphocytes Relative: 36 %
Lymphs Abs: 1.4 10*3/uL (ref 0.7–4.0)
MCH: 28.8 pg (ref 26.0–34.0)
MCHC: 32.5 g/dL (ref 30.0–36.0)
MCV: 88.5 fL (ref 80.0–100.0)
Monocytes Absolute: 0.5 10*3/uL (ref 0.1–1.0)
Monocytes Relative: 11 %
Neutro Abs: 2.1 10*3/uL (ref 1.7–7.7)
Neutrophils Relative %: 52 %
Platelets: 291 10*3/uL (ref 150–400)
RBC: 4.59 MIL/uL (ref 3.87–5.11)
RDW: 13.2 % (ref 11.5–15.5)
Smear Review: NORMAL
WBC: 4 10*3/uL (ref 4.0–10.5)
nRBC: 0 % (ref 0.0–0.2)

## 2019-08-09 LAB — PHOSPHORUS: Phosphorus: 4.2 mg/dL (ref 2.5–4.6)

## 2019-08-09 LAB — GLUCOSE, CAPILLARY
Glucose-Capillary: 111 mg/dL — ABNORMAL HIGH (ref 70–99)
Glucose-Capillary: 85 mg/dL (ref 70–99)
Glucose-Capillary: 91 mg/dL (ref 70–99)
Glucose-Capillary: 130 mg/dL — ABNORMAL HIGH (ref 70–99)

## 2019-08-09 LAB — FIBRIN DERIVATIVES D-DIMER (ARMC ONLY): Fibrin derivatives D-dimer (ARMC): 540.33 ng/mL (FEU) — ABNORMAL HIGH (ref 0.00–499.00)

## 2019-08-09 LAB — FERRITIN: Ferritin: 595 ng/mL — ABNORMAL HIGH (ref 11–307)

## 2019-08-09 LAB — C-REACTIVE PROTEIN: CRP: 0.5 mg/dL (ref <1.0)

## 2019-08-09 LAB — MAGNESIUM: Magnesium: 2.6 mg/dL — ABNORMAL HIGH (ref 1.7–2.4)

## 2019-08-09 NOTE — Progress Notes (Signed)
1        [place] at [facility]   PATIENT NAME: Angelica Robles    MR#:  [id number]  DATE OF BIRTH:  20-Aug-1990  SUBJECTIVE:  CHIEF COMPLAINT:   Chief Complaint  Patient presents with  . Chest Pain  No new complaints.  Improving REVIEW OF SYSTEMS:  Review of Systems  Constitutional: Negative for diaphoresis, fever, malaise/fatigue and weight loss.  HENT: Negative for ear discharge, ear pain, hearing loss, nosebleeds, sore throat and tinnitus.   Eyes: Negative for blurred vision and pain.  Respiratory: Positive for shortness of breath. Negative for cough, hemoptysis and wheezing.   Cardiovascular: Negative for chest pain, palpitations, orthopnea and leg swelling.  Gastrointestinal: Negative for abdominal pain, blood in stool, constipation, diarrhea, heartburn, nausea and vomiting.  Genitourinary: Negative for dysuria, frequency and urgency.  Musculoskeletal: Negative for back pain and myalgias.  Skin: Negative for itching and rash.  Neurological: Negative for dizziness, tingling, tremors, focal weakness, seizures, weakness and headaches.  Psychiatric/Behavioral: Negative for depression. The patient is not nervous/anxious.    DRUG ALLERGIES:  No Known Allergies VITALS:  Blood pressure (!) 105/55, pulse 67, temperature 98.7 F (37.1 C), temperature source Oral, resp. rate 15, height 5\' 1"  (1.549 m), weight 104.3 kg, SpO2 97 %. PHYSICAL EXAMINATION:  Physical Exam HENT:     Head: Normocephalic and atraumatic.  Eyes:     Conjunctiva/sclera: Conjunctivae normal.     Pupils: Pupils are equal, round, and reactive to light.  Neck:     Thyroid: No thyromegaly.     Trachea: No tracheal deviation.  Cardiovascular:     Rate and Rhythm: Normal rate and regular rhythm.     Heart sounds: Normal heart sounds.  Pulmonary:     Effort: Pulmonary effort is normal. No respiratory distress.     Breath sounds: Normal breath sounds. No wheezing.  Chest:     Chest wall: No  tenderness.  Abdominal:     General: Bowel sounds are normal. There is no distension.     Palpations: Abdomen is soft.     Tenderness: There is no abdominal tenderness.  Musculoskeletal:        General: Normal range of motion.     Cervical back: Normal range of motion and neck supple.  Skin:    General: Skin is warm and dry.     Findings: No rash.  Neurological:     Mental Status: She is alert and oriented to person, place, and time.     Cranial Nerves: No cranial nerve deficit.    LABORATORY PANEL:  Female CBC Recent Labs  Lab 08/09/19 0623  WBC 4.0  HGB 13.2  HCT 40.6  PLT 291   ------------------------------------------------------------------------------------------------------------------ Chemistries  Recent Labs  Lab 08/06/19 1215 08/07/19 0333 08/09/19 0623  NA 140  --   --   K 4.3  --   --   CL 104  --   --   CO2 24  --   --   GLUCOSE 118*  --   --   BUN 11  --   --   CREATININE 0.93  --   --   CALCIUM 8.6*  --   --   MG  --    < > 2.6*   < > = values in this interval not displayed.   RADIOLOGY:  No results found. ASSESSMENT AND PLAN:  29 year old female admitted for pneumonia due to Covid infection  Pneumonia due to Covid viral infection Continue  remdesivir and Decadron day 4/5 Vitamin C and zinc Proning 16+ hours a day when awake and possible On room air now  Morbid obesity Body mass index is 43.46 kg/m. Counseled for weight loss   Requests work excuse at discharge  Status is: Inpatient  Remains inpatient appropriate because:IV treatments appropriate due to intensity of illness or inability to take PO   Dispo: The patient is from: Home              Anticipated d/c is to: Home              Anticipated d/c date is: 1 day              Patient currently is not medically stable to d/c.  Needs 1 more days of IV remdesivir    DVT prophylaxis:            enoxaparin (LOVENOX) injection 40 mg Start: 08/06/19 2000     Family Communication:  discussed with patient   All the records are reviewed and case discussed with Care Management/Social Worker. Management plans discussed with the patient, nursing and they are in agreement.  CODE STATUS: Full Code  TOTAL TIME TAKING CARE OF THIS PATIENT: 25 minutes.   More than 50% of the time was spent in counseling/coordination of care: YES  POSSIBLE D/C IN 1 DAYS, DEPENDING ON CLINICAL CONDITION.   Delfino Lovett M.D on 08/09/2019 at 1:24 PM  [facility]   CC: Primary care physician; [facility], [place]  Note: This dictation was prepared with Dragon dictation along with smaller phrase technology. Any transcriptional errors that result from this process are unintentional.

## 2019-08-10 LAB — CBC WITH DIFFERENTIAL/PLATELET
Abs Immature Granulocytes: 0.02 10*3/uL (ref 0.00–0.07)
Basophils Absolute: 0 10*3/uL (ref 0.0–0.1)
Basophils Relative: 0 %
Eosinophils Absolute: 0 10*3/uL (ref 0.0–0.5)
Eosinophils Relative: 0 %
HCT: 41.8 % (ref 36.0–46.0)
Hemoglobin: 13.5 g/dL (ref 12.0–15.0)
Immature Granulocytes: 0 %
Lymphocytes Relative: 29 %
Lymphs Abs: 1.6 10*3/uL (ref 0.7–4.0)
MCH: 28.7 pg (ref 26.0–34.0)
MCHC: 32.3 g/dL (ref 30.0–36.0)
MCV: 88.9 fL (ref 80.0–100.0)
Monocytes Absolute: 0.6 10*3/uL (ref 0.1–1.0)
Monocytes Relative: 10 %
Neutro Abs: 3.4 10*3/uL (ref 1.7–7.7)
Neutrophils Relative %: 61 %
Platelets: 338 10*3/uL (ref 150–400)
RBC: 4.7 MIL/uL (ref 3.87–5.11)
RDW: 13.2 % (ref 11.5–15.5)
Smear Review: NORMAL
WBC: 5.7 10*3/uL (ref 4.0–10.5)
nRBC: 0 % (ref 0.0–0.2)

## 2019-08-10 LAB — FERRITIN: Ferritin: 567 ng/mL — ABNORMAL HIGH (ref 11–307)

## 2019-08-10 LAB — PHOSPHORUS: Phosphorus: 3.7 mg/dL (ref 2.5–4.6)

## 2019-08-10 LAB — GLUCOSE, CAPILLARY
Glucose-Capillary: 90 mg/dL (ref 70–99)
Glucose-Capillary: 78 mg/dL (ref 70–99)

## 2019-08-10 LAB — MAGNESIUM: Magnesium: 2.7 mg/dL — ABNORMAL HIGH (ref 1.7–2.4)

## 2019-08-10 LAB — FIBRIN DERIVATIVES D-DIMER (ARMC ONLY): Fibrin derivatives D-dimer (ARMC): 428.96 ng/mL (FEU) (ref 0.00–499.00)

## 2019-08-10 LAB — C-REACTIVE PROTEIN: CRP: 0.5 mg/dL (ref <1.0)

## 2019-08-10 MED ORDER — ADULT MULTIVITAMIN W/MINERALS CH: 1.0000 | ORAL_TABLET | Freq: Every day | ORAL | 0 refills | Status: DC

## 2019-08-10 MED ORDER — DEXAMETHASONE 6 MG PO TABS: 6.0000 mg | ORAL_TABLET | ORAL | 0 refills | Status: AC

## 2019-08-10 MED ORDER — ZINC SULFATE 220 (50 ZN) MG PO CAPS: 220.0000 mg | ORAL_CAPSULE | Freq: Every day | ORAL | 0 refills | Status: DC

## 2019-08-10 MED ORDER — ASCORBIC ACID 500 MG PO TABS: 500.0000 mg | ORAL_TABLET | Freq: Every day | ORAL | 0 refills | Status: DC

## 2019-08-10 MED ORDER — GUAIFENESIN-DM 100-10 MG/5ML PO SYRP: 10.0000 mL | ORAL_SOLUTION | ORAL | 0 refills | Status: DC | PRN

## 2019-08-10 NOTE — Discharge Summary (Signed)
Physician Discharge Summary  Angelica Robles KGU:[id number] DOB: 01-23-1990 DOA: 08/06/2019  PCP: [facility]  Admit date: 08/06/2019 Discharge date: 08/10/2019  Admitted From: Home Disposition: Home   Recommendations for Outpatient Follow-up:  1. Follow up with PCP in 1-2 weeks 2. Please obtain BMP/CBC in one week 3. Please follow up on the following pending results:None  Home Health:No Equipment/Devices:None Discharge Condition: Stable CODE STATUS: Full Diet recommendation: Heart Healthy / Carb Modified   Brief/Interim Summary: Angelica Robles is a 29 y.o. female with medical history significant for morbid obesity who presents to the emergency room for evaluation of shortness of breath.  Patient states that she has been sick for over a week with symptoms which she described as fever, dry cough, runny nose and generalized body aches.  She tested positive for the COVID-19 virus on August 01, 2019.  Chest x-ray with bilateral infiltrates consistent with COVID-19 pneumonia.  She completed the course of remdesivir.  Weaned off from oxygen.  Discharged home on 4 more days of Decadron. Patient was advised to keep her self quarantine for another week and follow-up with her PCP for further management.  Discharge Diagnoses:  Active Problems:   COVID-19   Obesity, Class III, BMI 40-49.9 (morbid obesity) (HCC)   COVID-19 virus infection   Discharge Instructions  Discharge Instructions    Diet - low sodium heart healthy   Complete by: As directed    Discharge instructions   Complete by: As directed    It was pleasure taking care of you. You are being given some supplement and four more days of steroid, please take it as directed. It is very important that you take your diabetic medications and follow-up with your primary care physician closely. You are being treated with steroid due to your COVID-19 infection which can worsen your diabetes. Keep yourself well-hydrated.    Increase activity slowly   Complete by: As directed      Allergies as of 08/10/2019   No Known Allergies     Medication List    TAKE these medications   ascorbic acid 500 MG tablet Commonly known as: VITAMIN C Take 1 tablet (500 mg total) by mouth daily.   dexamethasone 6 MG tablet Commonly known as: DECADRON Take 1 tablet (6 mg total) by mouth daily for 4 days.   guaiFENesin-dextromethorphan 100-10 MG/5ML syrup Commonly known as: ROBITUSSIN DM Take 10 mLs by mouth every 4 (four) hours as needed for cough.   metFORMIN 500 MG tablet Commonly known as: GLUCOPHAGE Take 1 tablet (500 mg total) by mouth 2 (two) times daily with a meal.   multivitamin with minerals Tabs tablet Take 1 tablet by mouth daily.   norethindrone-ethinyl estradiol 1-20 MG-MCG tablet Commonly known as: Junel FE 1/20 Take 1 tablet by mouth daily.   nystatin cream Commonly known as: MYCOSTATIN Apply 1 application topically 2 (two) times daily.   phentermine 37.5 MG tablet Commonly known as: ADIPEX-P Take 1 tablet (37.5 mg total) by mouth daily before breakfast.   terconazole 0.4 % vaginal cream Commonly known as: Terazol 7 Place 1 applicator vaginally at bedtime.   zinc sulfate 220 (50 Zn) MG capsule Take 1 capsule (220 mg total) by mouth daily.       Follow-up Information    [facility] [place]. Schedule an appointment as soon as possible for a visit.   Contact information: [address] [place] [address] [phone]  No Known Allergies  Consultations:  None  Procedures/Studies: DG Chest 2 View  Result Date: 08/06/2019 CLINICAL DATA:  Chest pain and shortness of breath four days. COVID-19 virus infection. EXAM: CHEST - 2 VIEW COMPARISON:  None FINDINGS: Very low lung volumes are seen with bibasilar atelectasis versus infiltrates. No evidence of pleural effusion. Heart size is within normal limits allowing for low lung volumes. IMPRESSION: Very low  lung volumes, with bibasilar atelectasis versus infiltrates. Electronically Signed   By: Danae Orleans M.D.   On: 08/06/2019 12:52    Subjective: Patient has no new complaint today.  She was feeling better and ready to go home.  She has questions regarding the duration of quarantine which were answered.  She was also requesting a letter for work which was also provided.  Discharge Exam: Vitals:   08/10/19 0536 08/10/19 0845  BP: 105/65 94/68  Pulse: 64 69  Resp: 18 19  Temp: 97.7 F (36.5 C) 97.6 F (36.4 C)  SpO2: 96% 99%   Vitals:   08/09/19 2011 08/10/19 0045 08/10/19 0536 08/10/19 0845  BP: 102/89 (!) 115/92 105/65 94/68  Pulse: 80 66 64 69  Resp: 16 16 18 19   Temp: 98.1 F (36.7 C) 97.8 F (36.6 C) 97.7 F (36.5 C) 97.6 F (36.4 C)  TempSrc: Oral Oral Oral Oral  SpO2: 97% 98% 96% 99%  Weight:      Height:        General: Pt is alert, awake, not in acute distress Cardiovascular: RRR, S1/S2 +, no rubs, no gallops Respiratory: CTA bilaterally, no wheezing, no rhonchi Abdominal: Soft, NT, ND, bowel sounds + Extremities: no edema, no cyanosis   The results of significant diagnostics from this hospitalization (including imaging, microbiology, ancillary and laboratory) are listed below for reference.    Microbiology: No results found for this or any previous visit (from the past 240 hour(s)).   Labs: BNP (last 3 results) No results for input(s): BNP in the last 8760 hours. Basic Metabolic Panel: Recent Labs  Lab 08/06/19 1215 08/07/19 0333 08/08/19 0531 08/09/19 0623 08/10/19 0527  NA 140  --   --   --   --   K 4.3  --   --   --   --   CL 104  --   --   --   --   CO2 24  --   --   --   --   GLUCOSE 118*  --   --   --   --   BUN 11  --   --   --   --   CREATININE 0.93  --   --   --   --   CALCIUM 8.6*  --   --   --   --   MG  --  2.3 2.6* 2.6* 2.7*  PHOS  --  2.9 4.0 4.2 3.7   Liver Function Tests: No results for input(s): AST, ALT, ALKPHOS, BILITOT,  PROT, ALBUMIN in the last 168 hours. No results for input(s): LIPASE, AMYLASE in the last 168 hours. No results for input(s): AMMONIA in the last 168 hours. CBC: Recent Labs  Lab 08/06/19 1215 08/07/19 0333 08/08/19 0531 08/09/19 0623 08/10/19 0527  WBC 5.2 2.4* 2.8* 4.0 5.7  NEUTROABS  --  1.4* 1.3* 2.1 3.4  HGB 14.6 13.0 13.6 13.2 13.5  HCT 45.1 40.9 42.3 40.6 41.8  MCV 88.4 89.7 88.9 88.5 88.9  PLT 208 205 263 291 338  Cardiac Enzymes: No results for input(s): CKTOTAL, CKMB, CKMBINDEX, TROPONINI in the last 168 hours. BNP: Invalid input(s): POCBNP CBG: Recent Labs  Lab 08/09/19 0727 08/09/19 1114 08/09/19 1558 08/09/19 2150 08/10/19 0843  GLUCAP 111* 85 91 130* 90   D-Dimer No results for input(s): DDIMER in the last 72 hours. Hgb A1c No results for input(s): HGBA1C in the last 72 hours. Lipid Profile No results for input(s): CHOL, HDL, LDLCALC, TRIG, CHOLHDL, LDLDIRECT in the last 72 hours. Thyroid function studies No results for input(s): TSH, T4TOTAL, T3FREE, THYROIDAB in the last 72 hours.  Invalid input(s): FREET3 Anemia work up Recent Labs    08/09/19 0623 08/10/19 0527  FERRITIN 595* 567*   Urinalysis    Component Value Date/Time   COLORURINE RED (A) 09/09/2015 0713   APPEARANCEUR CLOUDY (A) 09/09/2015 0713   LABSPEC 1.032 (H) 09/09/2015 0713   PHURINE  09/09/2015 0713    TEST NOT REPORTED DUE TO COLOR INTERFERENCE OF URINE PIGMENT   GLUCOSEU (A) 09/09/2015 0713    TEST NOT REPORTED DUE TO COLOR INTERFERENCE OF URINE PIGMENT   HGBUR (A) 09/09/2015 0713    TEST NOT REPORTED DUE TO COLOR INTERFERENCE OF URINE PIGMENT   BILIRUBINUR (A) 09/09/2015 0713    TEST NOT REPORTED DUE TO COLOR INTERFERENCE OF URINE PIGMENT   KETONESUR (A) 09/09/2015 0713    TEST NOT REPORTED DUE TO COLOR INTERFERENCE OF URINE PIGMENT   PROTEINUR (A) 09/09/2015 0713    TEST NOT REPORTED DUE TO COLOR INTERFERENCE OF URINE PIGMENT   NITRITE (A) 09/09/2015 0713    TEST  NOT REPORTED DUE TO COLOR INTERFERENCE OF URINE PIGMENT   LEUKOCYTESUR (A) 09/09/2015 0713    TEST NOT REPORTED DUE TO COLOR INTERFERENCE OF URINE PIGMENT   Sepsis Labs Invalid input(s): PROCALCITONIN,  WBC,  LACTICIDVEN Microbiology No results found for this or any previous visit (from the past 240 hour(s)).  Time coordinating discharge: Over 30 minutes  SIGNED:  Arnetha Courser, MD  [facility] 08/10/2019, 11:42 AM  If 7PM-7AM, please contact night-coverage [email or web address]  This record has been created using Conservation officer, historic buildings. Errors have been sought and corrected,but may not always be located. Such creation errors do not reflect on the standard of care.

## 2019-09-15 ENCOUNTER — Other Ambulatory Visit: Payer: Self-pay

## 2019-09-15 ENCOUNTER — Encounter: Payer: Self-pay | Admitting: Obstetrics and Gynecology

## 2019-09-15 ENCOUNTER — Ambulatory Visit (INDEPENDENT_AMBULATORY_CARE_PROVIDER_SITE_OTHER): Payer: Managed Care, Other (non HMO) | Admitting: Obstetrics and Gynecology

## 2019-09-15 VITALS — BP 126/84 | HR 72 | Ht 61.0 in | Wt 249.0 lb

## 2019-09-15 DIAGNOSIS — Z01419 Encounter for gynecological examination (general) (routine) without abnormal findings: Secondary | ICD-10-CM | POA: Diagnosis not present

## 2019-09-15 DIAGNOSIS — B354 Tinea corporis: Secondary | ICD-10-CM

## 2019-09-15 DIAGNOSIS — Z1239 Encounter for other screening for malignant neoplasm of breast: Secondary | ICD-10-CM | POA: Diagnosis not present

## 2019-09-15 MED ORDER — NYSTATIN 100000 UNIT/GM EX CREA: 1.0000 "application " | TOPICAL_CREAM | Freq: Two times a day (BID) | CUTANEOUS | 1 refills | Status: DC

## 2019-09-15 NOTE — Progress Notes (Signed)
Gynecology Annual Exam   PCP: [facility], [place]  Chief Complaint:  Chief Complaint  Patient presents with  . Gynecologic Exam    History of Present Illness: Patient is a 29 y.o. G0P0000 presents for annual exam. The patient has no complaints today.   LMP: Patient's last menstrual period was 09/02/2019. Average Interval: regular, 28 days Duration of flow: 5 days Heavy Menses: yes Intermenstrual Bleeding: no Dysmenorrhea: yes  The patient is sexually active. She currently uses condoms for contraception. She denies dyspareunia.  The patient does not perform self breast exams.  There is no notable family history of breast or ovarian cancer in her family.  The patient wears seatbelts: yes.   The patient has regular exercise: not asked.    The patient denies current symptoms of depression.    Review of Systems: Review of Systems  Constitutional: Negative for chills and fever.  HENT: Negative for congestion.   Respiratory: Negative for cough and shortness of breath.   Cardiovascular: Negative for chest pain and palpitations.  Gastrointestinal: Negative for abdominal pain, constipation, diarrhea, heartburn, nausea and vomiting.  Genitourinary: Negative for dysuria, frequency and urgency.  Skin: Negative for itching and rash.  Neurological: Negative for dizziness and headaches.  Endo/Heme/Allergies: Negative for polydipsia.  Psychiatric/Behavioral: Negative for depression.    Past Medical History:  Patient Active Problem List   Diagnosis Date Noted  . COVID-19 08/06/2019  . Obesity, Class III, BMI 40-49.9 (morbid obesity) (HCC) 08/06/2019  . COVID-19 virus infection 08/06/2019    Past Surgical History:  Past Surgical History:  Procedure Laterality Date  . TONSILLECTOMY      Gynecologic History:  Patient's last menstrual period was 09/02/2019. Contraception: condoms Last Pap: Results were: 04/22/2018 no abnormalities   Obstetric History: G0P0000  Family  History:  Family History  Problem Relation Age of Onset  . Prostate cancer Father 90  . Breast cancer Maternal Grandmother 32    Social History:  Social History   Socioeconomic History  . Marital status: Single    Spouse name: Not on file  . Number of children: Not on file  . Years of education: Not on file  . Highest education level: Not on file  Occupational History  . Not on file  Tobacco Use  . Smoking status: Never Smoker  . Smokeless tobacco: Never Used  Vaping Use  . Vaping Use: Never used  Substance and Sexual Activity  . Alcohol use: No  . Drug use: No  . Sexual activity: Yes    Birth control/protection: None  Other Topics Concern  . Not on file  Social History Narrative  . Not on file   Social Determinants of Health   Financial Resource Strain:   . Difficulty of Paying Living Expenses: Not on file  Food Insecurity:   . Worried About Programme researcher, broadcasting/film/video in the Last Year: Not on file  . Ran Out of Food in the Last Year: Not on file  Transportation Needs:   . Lack of Transportation (Medical): Not on file  . Lack of Transportation (Non-Medical): Not on file  Physical Activity:   . Days of Exercise per Week: Not on file  . Minutes of Exercise per Session: Not on file  Stress:   . Feeling of Stress : Not on file  Social Connections:   . Frequency of Communication with Friends and Family: Not on file  . Frequency of Social Gatherings with Friends and Family: Not on file  .  Attends Religious Services: Not on file  . Active Member of Clubs or Organizations: Not on file  . Attends Banker Meetings: Not on file  . Marital Status: Not on file  Intimate Partner Violence:   . Fear of Current or Ex-Partner: Not on file  . Emotionally Abused: Not on file  . Physically Abused: Not on file  . Sexually Abused: Not on file    Allergies:  No Known Allergies  Medications: Prior to Admission medications   Medication Sig Start Date End Date Taking?  Authorizing Provider  ascorbic acid (VITAMIN C) 500 MG tablet Take 1 tablet (500 mg total) by mouth daily. 08/10/19   Arnetha Courser, MD  guaiFENesin-dextromethorphan (ROBITUSSIN DM) 100-10 MG/5ML syrup Take 10 mLs by mouth every 4 (four) hours as needed for cough. 08/10/19   Arnetha Courser, MD  metFORMIN (GLUCOPHAGE) 500 MG tablet Take 1 tablet (500 mg total) by mouth 2 (two) times daily with a meal. Patient not taking: Reported on 08/06/2019 01/08/18   Vena Austria, MD  Multiple Vitamin (MULTIVITAMIN WITH MINERALS) TABS tablet Take 1 tablet by mouth daily. 08/10/19   Arnetha Courser, MD  norethindrone-ethinyl estradiol (JUNEL FE 1/20) 1-20 MG-MCG tablet Take 1 tablet by mouth daily. 08/06/18   Vena Austria, MD  nystatin cream (MYCOSTATIN) Apply 1 application topically 2 (two) times daily. Patient not taking: Reported on 08/06/2019 04/22/18   Vena Austria, MD  phentermine (ADIPEX-P) 37.5 MG tablet Take 1 tablet (37.5 mg total) by mouth daily before breakfast. Patient not taking: Reported on 08/06/2019 04/10/18   Vena Austria, MD  terconazole (TERAZOL 7) 0.4 % vaginal cream Place 1 applicator vaginally at bedtime. Patient not taking: Reported on 08/06/2019 04/22/18   Vena Austria, MD  zinc sulfate 220 (50 Zn) MG capsule Take 1 capsule (220 mg total) by mouth daily. 08/10/19   Arnetha Courser, MD    Physical Exam Vitals: Blood pressure 126/84, pulse 72, height 5\' 1"  (1.549 m), weight 249 lb (112.9 kg), last menstrual period 09/02/2019. Body mass index is 47.05 kg/m.   General: NAD HEENT: normocephalic, anicteric Thyroid: no enlargement, no palpable nodules Pulmonary: No increased work of breathing, CTAB Cardiovascular: RRR, distal pulses 2+ Breast: Breast symmetrical, no tenderness, no palpable nodules or masses, no skin or nipple retraction present, no nipple discharge.  No axillary or supraclavicular lymphadenopathy. Abdomen: NABS, soft, non-tender, non-distended.  Umbilicus without lesions.   No hepatomegaly, splenomegaly or masses palpable. No evidence of hernia  Genitourinary:  External: Normal external female genitalia.  Normal urethral meatus, normal Bartholin's and Skene's glands.    Vagina: Normal vaginal mucosa, no evidence of prolapse.    Cervix: Grossly normal in appearance, no bleeding  Uterus: Non-enlarged, mobile, normal contour.  No CMT  Adnexa: ovaries non-enlarged, no adnexal masses  Rectal: deferred  Lymphatic: no evidence of inguinal lymphadenopathy Extremities: no edema, erythema, or tenderness Neurologic: Grossly intact Psychiatric: mood appropriate, affect full  Female chaperone present for pelvic and breast  portions of the physical exam    Assessment: 29 y.o. G0P0000 routine annual exam  Plan: Problem List Items Addressed This Visit    None    Visit Diagnoses    Encounter for gynecological examination without abnormal finding    -  Primary   Breast screening       Tinea corporis       Relevant Medications   nystatin cream (MYCOSTATIN)      2) STI screening  wasoffered and declined  2)  ASCCP guidelines  and rational discussed.  Patient opts for every 3 years screening interval  3) Contraception - the patient is currently using  condoms.  She is happy with her current form of contraception and plans to continue  4) Routine healthcare maintenance including cholesterol, diabetes screening discussed managed by PCP  5) Return in about 1 year (around 09/14/2020) for annual.   Vena Austria, MD, [facility], [facility] 09/15/2019, 3:05 PM

## 2019-10-22 ENCOUNTER — Encounter: Payer: Self-pay | Admitting: Advanced Practice Midwife

## 2019-10-22 ENCOUNTER — Other Ambulatory Visit (HOSPITAL_COMMUNITY)
Admission: RE | Admit: 2019-10-22 | Discharge: 2019-10-22 | Disposition: A | Discharge status: Home / Self Care | Payer: Managed Care, Other (non HMO) | Source: Ambulatory Visit | Attending: Advanced Practice Midwife | Admitting: Advanced Practice Midwife

## 2019-10-22 ENCOUNTER — Other Ambulatory Visit: Payer: Self-pay

## 2019-10-22 ENCOUNTER — Ambulatory Visit (INDEPENDENT_AMBULATORY_CARE_PROVIDER_SITE_OTHER): Payer: Managed Care, Other (non HMO) | Admitting: Advanced Practice Midwife

## 2019-10-22 VITALS — BP 122/74 | Wt 253.0 lb

## 2019-10-22 DIAGNOSIS — R300 Dysuria: Secondary | ICD-10-CM | POA: Diagnosis not present

## 2019-10-22 DIAGNOSIS — N898 Other specified noninflammatory disorders of vagina: Secondary | ICD-10-CM | POA: Insufficient documentation

## 2019-10-22 DIAGNOSIS — Z113 Encounter for screening for infections with a predominantly sexual mode of transmission: Secondary | ICD-10-CM | POA: Diagnosis present

## 2019-10-22 LAB — POCT URINALYSIS DIPSTICK
Bilirubin, UA: NEGATIVE
Blood, UA: NEGATIVE
Glucose, UA: NEGATIVE
Ketones, UA: NEGATIVE
Leukocytes, UA: NEGATIVE
Nitrite, UA: NEGATIVE
Protein, UA: NEGATIVE
Spec Grav, UA: 1.015 (ref 1.010–1.025)
Urobilinogen, UA: NEGATIVE E.U./dL — AB
pH, UA: 6 (ref 5.0–8.0)

## 2019-10-22 MED ORDER — FLUCONAZOLE 150 MG PO TABS: 150.0000 mg | ORAL_TABLET | Freq: Once | ORAL | 1 refills | Status: AC

## 2019-10-22 NOTE — Progress Notes (Signed)
Patient ID: Angelica Robles, female   DOB: 04/18/1990, 29 y.o.   MRN: [id number]  Reason for Consult: Dysuria    Subjective:  HPI:  Angelica Robles is a 29 y.o. female being seen for symptoms of UTI/vaginitis. Four days ago she began having UTI symptoms- pain, frequency, urgency. She had a televisit and was prescribed 3 day treatment of Bactrim. Then yesterday she began having vaginal burning and itching. She has a history of yeast vaginitis. She denies discharge or odor. She used nystatin topically in the past couple days for external symptoms. The pain she is having is internal. She accepts STD testing on the vaginitis swab.   Past Medical History:  Diagnosis Date  . Oligomenorrhea    Family History  Problem Relation Age of Onset  . Prostate cancer Father 56  . Breast cancer Maternal Grandmother 28   Past Surgical History:  Procedure Laterality Date  . TONSILLECTOMY      Short Social History:  Social History   Tobacco Use  . Smoking status: Never Smoker  . Smokeless tobacco: Never Used  Substance Use Topics  . Alcohol use: No    No Known Allergies  Current Outpatient Medications  Medication Sig Dispense Refill  . fluconazole (DIFLUCAN) 150 MG tablet Take 1 tablet (150 mg total) by mouth once for 1 dose. Can take additional dose three days later if symptoms persist 1 tablet 1  . nystatin cream (MYCOSTATIN) Apply 1 application topically 2 (two) times daily. 30 g 1   No current facility-administered medications for this visit.   Review of Systems  Constitutional: Negative for chills and fever.  HENT: Negative for congestion, ear discharge, ear pain, hearing loss, sinus pain and sore throat.   Eyes: Negative for blurred vision and double vision.  Respiratory: Negative for cough, shortness of breath and wheezing.   Cardiovascular: Negative for chest pain, palpitations and leg swelling.  Gastrointestinal: Negative for abdominal pain, blood in stool, constipation,  diarrhea, heartburn, melena, nausea and vomiting.  Genitourinary: Positive for dysuria, frequency and urgency. Negative for flank pain and hematuria.       Positive for vaginal irritation and itching  Musculoskeletal: Negative for back pain, joint pain and myalgias.  Skin: Negative for itching and rash.  Neurological: Negative for dizziness, tingling, tremors, sensory change, speech change, focal weakness, seizures, loss of consciousness, weakness and headaches.  Endo/Heme/Allergies: Negative for environmental allergies. Does not bruise/bleed easily.  Psychiatric/Behavioral: Negative for depression, hallucinations, memory loss, substance abuse and suicidal ideas. The patient is not nervous/anxious and does not have insomnia.         Objective:  Objective   Vitals:   10/22/19 1456  BP: 122/74  Weight: 253 lb (114.8 kg)   Body mass index is 47.8 kg/m. Constitutional: Well nourished, well developed female in no acute distress.  HEENT: normal Skin: Warm and dry.  Respiratory:  Normal respiratory effort Back: no CVAT Neuro: DTRs 2+, Cranial nerves grossly intact Psych: Alert and Oriented x3. No memory deficits. Normal mood and affect.  MS: normal gait, normal bilateral lower extremity ROM/strength/stability.  Pelvic exam:  is not limited by body habitus EGBUS: within normal limits Vagina: within normal limits and with slightly inflamed mucosa, scant thin white discharge  Cervix: not evaluated   Assessment/Plan:     29 y.o. G0 P0 female with possible yeast or BV vaginitis vs UTI  Urine culture Aptima: Vaginitis/STD Rx Diflucan Follow up as needed after labs result    Tresea Mall  CNM [facility] 10/22/2019, 3:37 PM

## 2019-10-24 ENCOUNTER — Other Ambulatory Visit: Payer: Self-pay | Admitting: Advanced Practice Midwife

## 2019-10-24 DIAGNOSIS — B9689 Other specified bacterial agents as the cause of diseases classified elsewhere: Secondary | ICD-10-CM

## 2019-10-24 LAB — CERVICOVAGINAL ANCILLARY ONLY
Bacterial Vaginitis (gardnerella): POSITIVE — AB
Candida Glabrata: NEGATIVE
Candida Vaginitis: NEGATIVE
Chlamydia: NEGATIVE
Comment: NEGATIVE
Comment: NEGATIVE
Comment: NEGATIVE
Comment: NEGATIVE
Comment: NEGATIVE
Comment: NORMAL
Neisseria Gonorrhea: NEGATIVE
Trichomonas: NEGATIVE

## 2019-10-24 MED ORDER — METRONIDAZOLE 500 MG PO TABS: 500.0000 mg | ORAL_TABLET | Freq: Two times a day (BID) | ORAL | 0 refills | Status: AC

## 2019-10-24 NOTE — Progress Notes (Signed)
Rx metronidazole sent to patient's pharmacy to treat BV infection. Message sent to patient regarding lab result and Rx.

## 2019-10-25 LAB — URINE CULTURE

## 2020-02-06 ENCOUNTER — Ambulatory Visit (LOCAL_COMMUNITY_HEALTH_CENTER): Payer: Managed Care, Other (non HMO)

## 2020-02-06 ENCOUNTER — Other Ambulatory Visit: Payer: Self-pay

## 2020-02-06 DIAGNOSIS — Z111 Encounter for screening for respiratory tuberculosis: Secondary | ICD-10-CM

## 2020-02-09 ENCOUNTER — Other Ambulatory Visit: Payer: Self-pay

## 2020-02-09 ENCOUNTER — Ambulatory Visit (LOCAL_COMMUNITY_HEALTH_CENTER): Payer: Managed Care, Other (non HMO)

## 2020-02-09 DIAGNOSIS — Z111 Encounter for screening for respiratory tuberculosis: Secondary | ICD-10-CM

## 2020-02-09 LAB — TB SKIN TEST
Induration: 0 mm
TB Skin Test: NEGATIVE

## 2020-04-29 NOTE — Patient Instructions (Signed)
I value your feedback and you entrusting us with your care. If you get a [place] patient survey, I would appreciate you taking the time to let us know about your experience today. Thank you! ? ? ?

## 2020-04-29 NOTE — Progress Notes (Signed)
[facility], [place]   Chief Complaint  Patient presents with  . Vaginal Exam    Discomfort, no discharge, itchiness or odor x 1 week, would like STD testing/blood work  . [employer] Employee    HPI:      Angelica Robles is a 30 y.o. G0P0000 whose LMP was No LMP recorded (lmp unknown). (Menstrual status: Irregular Periods)., presents today for vaginal discomfort without increased d/c, odor, itching. Feels like early yeast infection. Had unprotected sex last wk so wants to be safe. Hx of BV in past, wants full STD testing. No pelvic pain, fevers, LBP, urin sx. Uses sens skin soap, no dryer sheets. No recent abx use, no meds to tx. No hx of STDs.   Pt is amenorrheic with hx of irregular menses. Did OCPs with cycle control but stopped them last yr. Hasn't had a period since 8/21. Had GYN u/s in past and had "sick uterus". Doesn't think she has had labs done; has not been told she has PCOS per pt.   Past Medical History:  Diagnosis Date  . Oligomenorrhea     Past Surgical History:  Procedure Laterality Date  . TONSILLECTOMY      Family History  Problem Relation Age of Onset  . Prostate cancer Father 3  . Breast cancer Maternal Grandmother 39    Social History   Socioeconomic History  . Marital status: Single    Spouse name: Not on file  . Number of children: Not on file  . Years of education: Not on file  . Highest education level: Not on file  Occupational History  . Not on file  Tobacco Use  . Smoking status: Never Smoker  . Smokeless tobacco: Never Used  Vaping Use  . Vaping Use: Never used  Substance and Sexual Activity  . Alcohol use: No  . Drug use: No  . Sexual activity: Yes    Birth control/protection: None, Condom  Other Topics Concern  . Not on file  Social History Narrative  . Not on file   Social Determinants of Health   Financial Resource Strain: Not on file  Food Insecurity: Not on file  Transportation Needs: Not on file  Physical  Activity: Not on file  Stress: Not on file  Social Connections: Not on file  Intimate Partner Violence: Not on file    Outpatient Medications Prior to Visit  Medication Sig Dispense Refill  . nystatin cream (MYCOSTATIN) Apply 1 application topically 2 (two) times daily. 30 g 1   No facility-administered medications prior to visit.      ROS:  Review of Systems  Constitutional: Negative for fever.  Gastrointestinal: Negative for blood in stool, constipation, diarrhea, nausea and vomiting.  Genitourinary: Positive for vaginal pain. Negative for dyspareunia, dysuria, flank pain, frequency, hematuria, urgency, vaginal bleeding and vaginal discharge.  Musculoskeletal: Negative for back pain.  Skin: Negative for rash.  BREAST: No symptoms   OBJECTIVE:   Vitals:  BP 110/80   Ht 5\' 1"  (1.549 m)   Wt 252 lb (114.3 kg)   LMP  (LMP Unknown)   BMI 47.61 kg/m   Physical Exam Vitals reviewed.  Constitutional:      Appearance: She is well-developed.  Pulmonary:     Effort: Pulmonary effort is normal.  Genitourinary:    General: Normal vulva.     Pubic Area: No rash.      Labia:        Right: No rash, tenderness or  lesion.        Left: No rash, tenderness or lesion.      Vagina: Normal. No vaginal discharge, erythema or tenderness.     Cervix: Normal.     Uterus: Normal. Not enlarged and not tender.      Adnexa: Right adnexa normal and left adnexa normal.       Right: No mass or tenderness.         Left: No mass or tenderness.    Musculoskeletal:        General: Normal range of motion.     Cervical back: Normal range of motion.  Skin:    General: Skin is warm and dry.  Neurological:     General: No focal deficit present.     Mental Status: She is alert and oriented to person, place, and time.  Psychiatric:        Mood and Affect: Mood normal.        Behavior: Behavior normal.        Thought Content: Thought content normal.        Judgment: Judgment normal.      Results: Results for orders placed or performed in visit on 04/30/20 (from the past 24 hour(s))  POCT Wet Prep with KOH     Status: Abnormal   Collection Time: 04/30/20 11:51 AM  Result Value Ref Range   Trichomonas, UA Negative    Clue Cells Wet Prep HPF POC pos    Epithelial Wet Prep HPF POC     Yeast Wet Prep HPF POC neg    Bacteria Wet Prep HPF POC     RBC Wet Prep HPF POC     WBC Wet Prep HPF POC     KOH Prep POC Positive (A) Negative     Assessment/Plan: BV (bacterial vaginosis) - Plan: metroNIDAZOLE (METROGEL) 0.75 % vaginal gel, POCT Wet Prep with KOH; pos wet prep. Rx metrogel, no EtOH. F/u prn.   Screening for STD (sexually transmitted disease) - Plan: HIV Antibody (routine testing w rflx), RPR, HSV 2 antibody, IgG, Hepatitis C antibody, Chlamydia/Gonococcus/Trichomonas, NAA; check STDs. F/u prn.   Amenorrhea--no menses since 8/21. Check serum beta today since last sex activity 1 wk ago. If neg, will do provera challenge. Will eval further if no withdrawal bleed. Otherwise, can eval and manage further at 9/22 annual with AS.     Meds ordered this encounter  Medications  . metroNIDAZOLE (METROGEL) 0.75 % vaginal gel    Sig: Place 1 Applicatorful vaginally at bedtime for 5 days.    Dispense:  50 g    Refill:  0    Order Specific Question:   Supervising Provider    Answer:   Nadara Mustard [[id number]]      Return if symptoms worsen or fail to improve.  Bradee Common B. Sol Englert, PA-C 04/30/2020 12:00 PM

## 2020-04-30 ENCOUNTER — Other Ambulatory Visit: Payer: Self-pay

## 2020-04-30 ENCOUNTER — Ambulatory Visit (INDEPENDENT_AMBULATORY_CARE_PROVIDER_SITE_OTHER): Payer: Managed Care, Other (non HMO) | Admitting: Obstetrics and Gynecology

## 2020-04-30 ENCOUNTER — Encounter: Payer: Self-pay | Admitting: Obstetrics and Gynecology

## 2020-04-30 VITALS — BP 110/80 | Ht 61.0 in | Wt 252.0 lb

## 2020-04-30 DIAGNOSIS — Z113 Encounter for screening for infections with a predominantly sexual mode of transmission: Secondary | ICD-10-CM | POA: Diagnosis not present

## 2020-04-30 DIAGNOSIS — N912 Amenorrhea, unspecified: Secondary | ICD-10-CM

## 2020-04-30 DIAGNOSIS — B9689 Other specified bacterial agents as the cause of diseases classified elsewhere: Secondary | ICD-10-CM | POA: Diagnosis not present

## 2020-04-30 DIAGNOSIS — N760 Acute vaginitis: Secondary | ICD-10-CM | POA: Diagnosis not present

## 2020-04-30 LAB — POCT WET PREP WITH KOH
Clue Cells Wet Prep HPF POC: POSITIVE
KOH Prep POC: POSITIVE — AB
Trichomonas, UA: NEGATIVE
Yeast Wet Prep HPF POC: NEGATIVE

## 2020-04-30 MED ORDER — METRONIDAZOLE 0.75 % VA GEL: 1.0000 | Freq: Every day | VAGINAL | 0 refills | Status: AC

## 2020-05-01 LAB — HEPATITIS C ANTIBODY: Hep C Virus Ab: <0.1 {s_co_ratio} (ref 0.0–0.9)

## 2020-05-01 LAB — SYPHILIS: RPR W/REFLEX TO RPR TITER AND TREPONEMAL ANTIBODIES, TRADITIONAL SCREENING AND DIAGNOSIS ALGORITHM: RPR Ser Ql: NONREACTIVE

## 2020-05-01 LAB — BETA HCG QUANT (REF LAB): hCG Quant: <1 m[IU]/mL

## 2020-05-01 LAB — HIV ANTIBODY (ROUTINE TESTING W REFLEX): HIV Screen 4th Generation wRfx: NONREACTIVE

## 2020-05-01 LAB — HSV 2 ANTIBODY, IGG: HSV 2 IgG, Type Spec: <0.91 {index} (ref 0.00–0.90)

## 2020-05-02 MED ORDER — MEDROXYPROGESTERONE ACETATE 10 MG PO TABS: 10.0000 mg | ORAL_TABLET | Freq: Every day | ORAL | 0 refills | Status: DC

## 2020-05-02 NOTE — Addendum Note (Signed)
Addended by: Althea Grimmer B on: 05/02/2020 10:11 AM   Modules accepted: Orders

## 2020-05-05 LAB — CHLAMYDIA/GONOCOCCUS/TRICHOMONAS, NAA
Chlamydia by NAA: NEGATIVE
Gonococcus by NAA: NEGATIVE
Trich vag by NAA: NEGATIVE

## 2020-05-19 ENCOUNTER — Other Ambulatory Visit: Payer: Self-pay | Admitting: Unknown Physician Specialty

## 2020-05-19 DIAGNOSIS — R42 Dizziness and giddiness: Secondary | ICD-10-CM

## 2020-06-01 ENCOUNTER — Other Ambulatory Visit: Payer: Self-pay

## 2020-06-01 ENCOUNTER — Ambulatory Visit
Admission: RE | Admit: 2020-06-01 | Discharge: 2020-06-01 | Disposition: A | Discharge status: Home / Self Care | Payer: Managed Care, Other (non HMO) | Source: Ambulatory Visit | Attending: Unknown Physician Specialty | Admitting: Unknown Physician Specialty

## 2020-06-01 DIAGNOSIS — R42 Dizziness and giddiness: Secondary | ICD-10-CM | POA: Diagnosis present

## 2020-06-01 IMAGING — MR MR BRAIN/IAC WO/W CM
13 of 15 series · 35 of 48 positions shown · IV contrast (gadavist)
Comparison: None.

CLINICAL DATA: Dizziness.  Off balance.

EXAM:
MRI HEAD WITHOUT AND WITH CONTRAST
TECHNIQUE: Multiplanar, multiecho pulse sequences of the brain and surrounding
structures were obtained without and with intravenous contrast.
CONTRAST:  10mL GADAVIST GADOBUTROL 1 MMOL/ML IV SOLN

[Series 5: T1 · sagittal · 5.0mm · 0.62mm/px · 1 of 21 slices shown (1 of 3)]
[im 1/21]
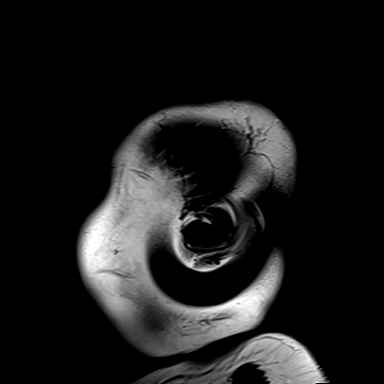

[Series 6: T2 · axial · 5.0mm · 0.53mm/px · z∈[-73,+71]mm · 2 of 25 slices shown]
[im 1/25]
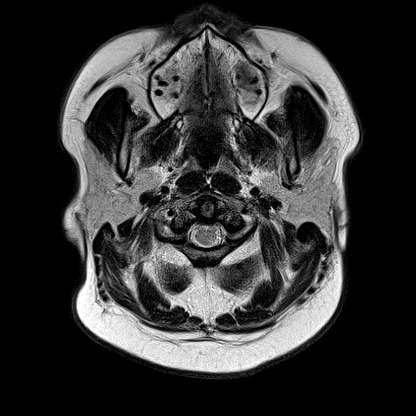
[im 25/25]
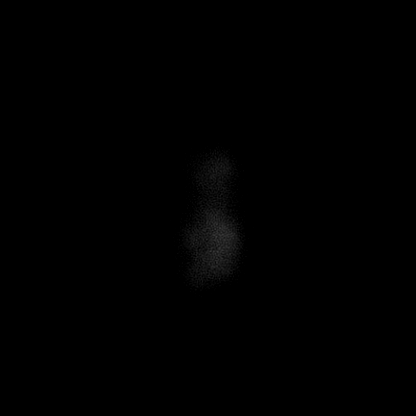

[Series 7: ax dwi_tracew · axial · 3.0mm · 0.65mm/px · z∈[-79,+76]mm · 7 of 95 slices shown]
[im 1/95]
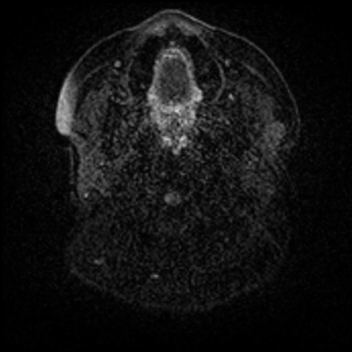
[im 16/95]
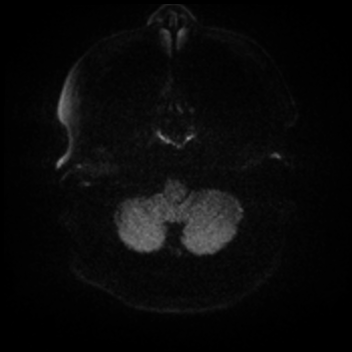
[im 32/95]
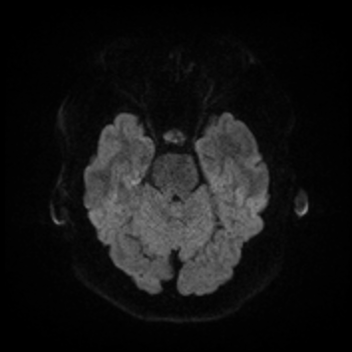
[im 48/95]
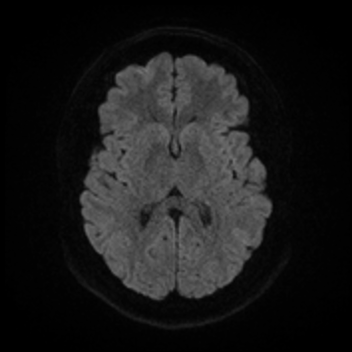
[im 63/95]
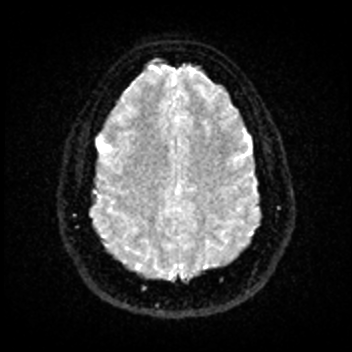
[im 79/95]
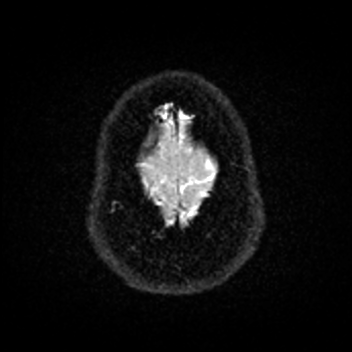
[im 95/95]
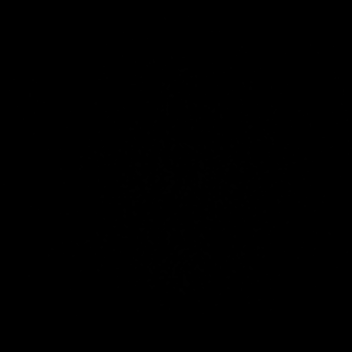

[Series 8: ax dwi_adc · axial · 3.0mm · 0.65mm/px · z∈[-79,+69]mm · 3 of 46 slices shown]
[im 1/46]
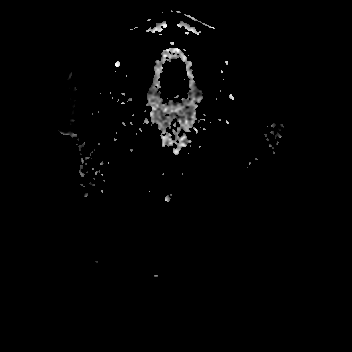
[im 23/46]
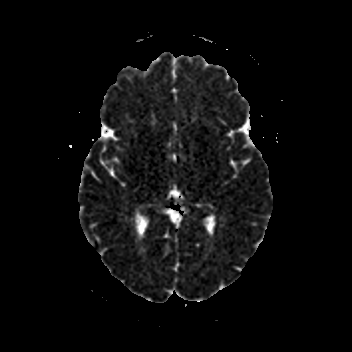
[im 46/46]
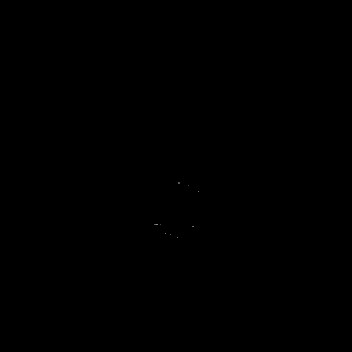

[Series 9: T1 · coronal · non-contrast · 3.0mm · 0.31mm/px · 1 of 13 slices shown (2 of 3)]
[im 1/13]
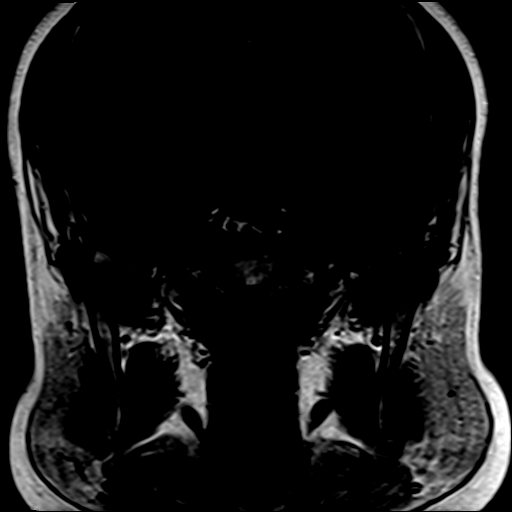

[Series 10: mag_images · axial · 3.0mm · 0.90mm/px · z∈[-89,+87]mm · 4 of 60 slices shown]
[im 1/60]
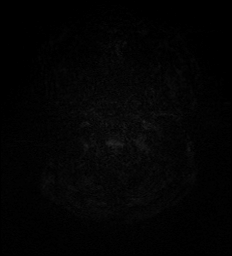
[im 20/60]
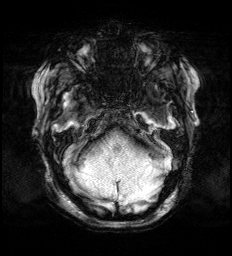
[im 40/60]
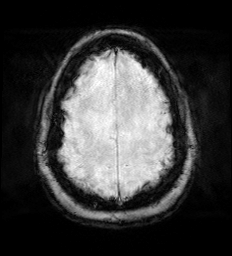
[im 60/60]
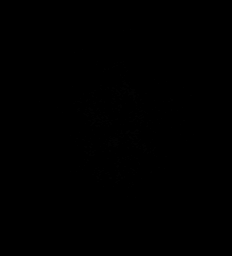

[Series 11: pha_images · axial · 3.0mm · 0.90mm/px · 1 of 57 slices shown]
[im 1/57]
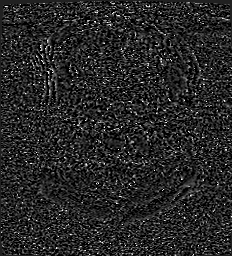

[Series 14: FLAIR · axial · 3.0mm · 0.53mm/px · z∈[-82,+80]mm · 4 of 55 slices shown (1 of 2)]
[im 1/55]
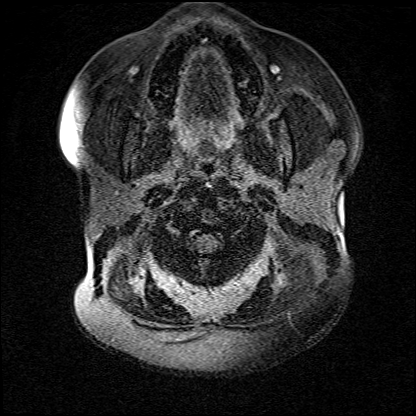
[im 19/55]
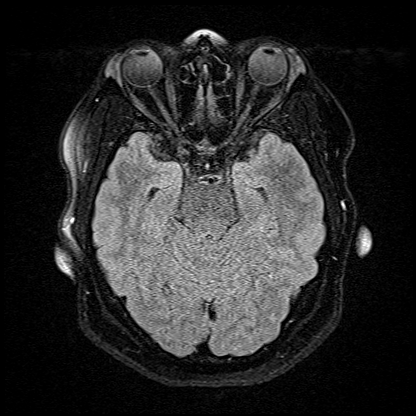
[im 37/55]
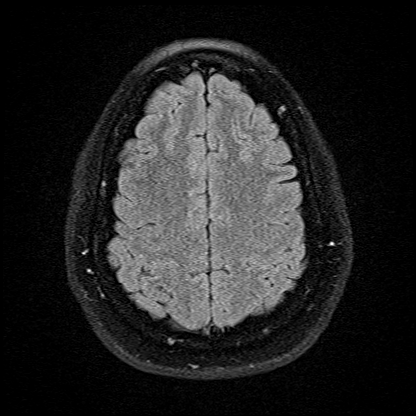
[im 55/55]
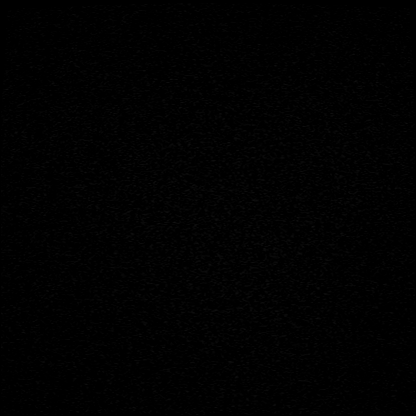

[Series 16: T1 · axial · non-contrast · 3.0mm · 0.21mm/px · 1 of 15 slices shown (3 of 3)]
[im 1/15]
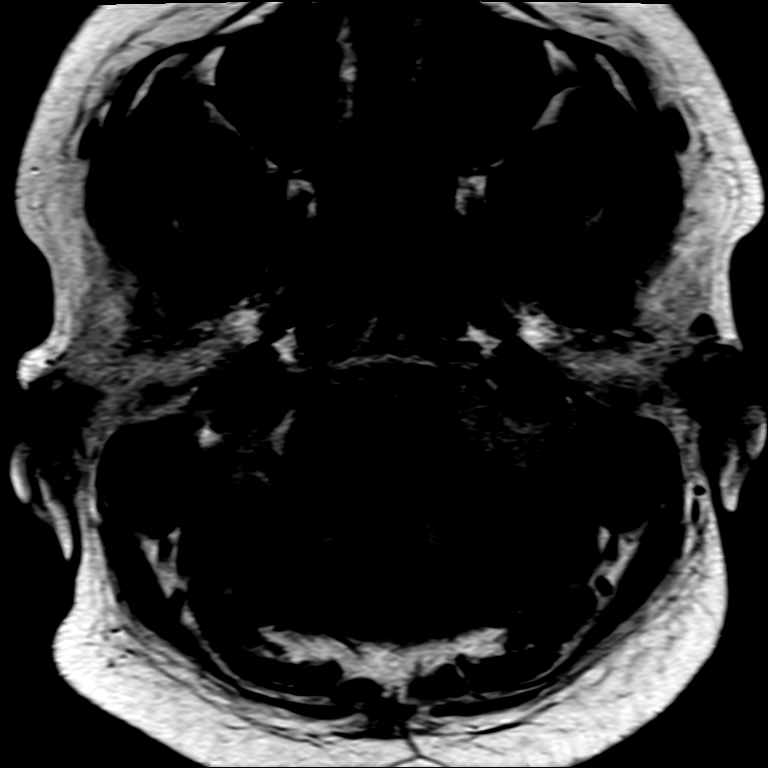

[Series 17: FLAIR · sagittal · 5.0mm · 0.94mm/px · 1 of 21 slices shown (2 of 2)]
[im 1/21]
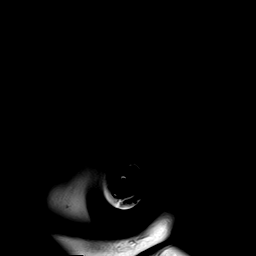

[Series 18: T1 post-contrast · axial · 3.0mm · 0.21mm/px · 1 of 15 slices shown (1 of 3)]
[im 1/15]
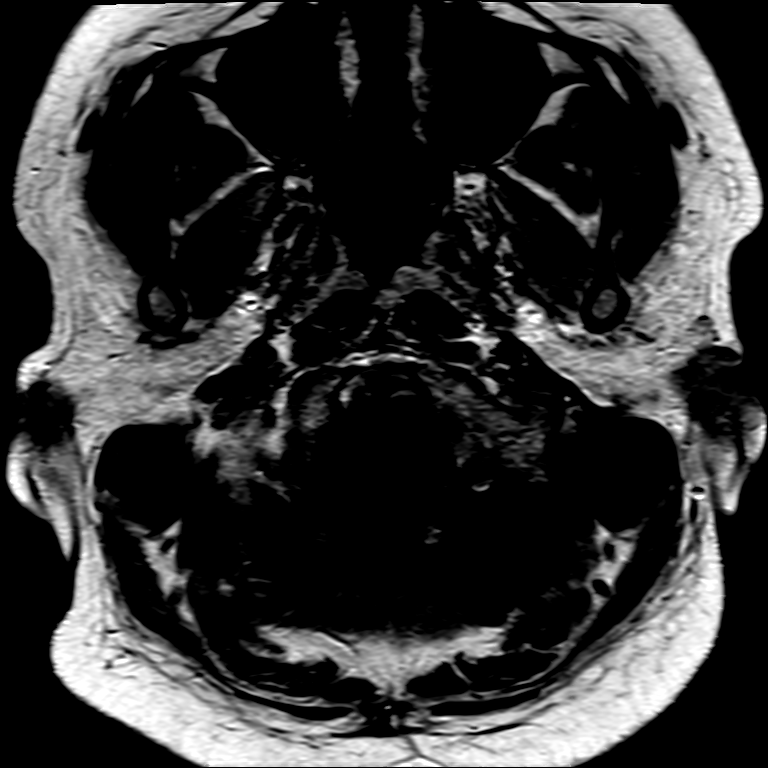

[Series 19: T1 post-contrast · coronal · 3.0mm · 0.31mm/px · 1 of 13 slices shown (2 of 3)]
[im 1/13]
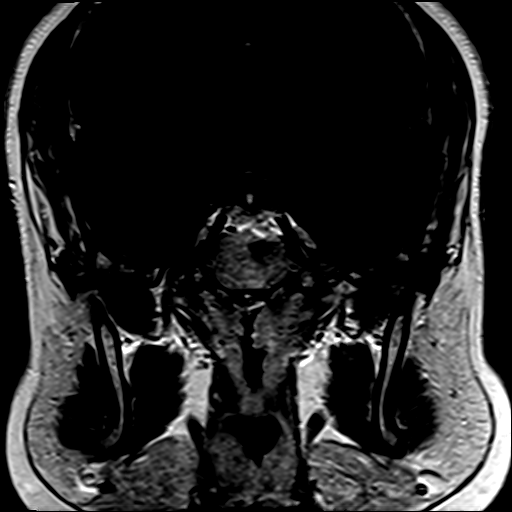

[Series 20: T1 post-contrast · axial · 1.0mm · 0.98mm/px · z∈[-74,+69]mm · 8 of 144 slices shown (3 of 3)]
[im 1/144]
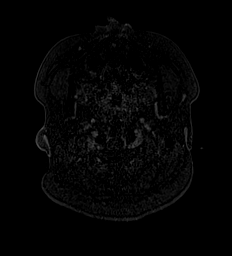
[im 16/144]
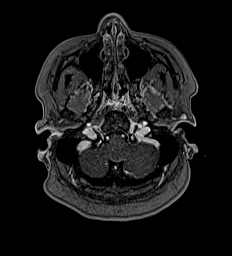
[im 48/144]
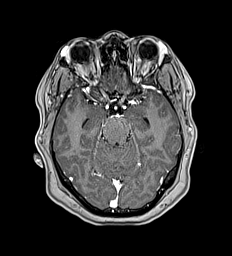
[im 64/144]
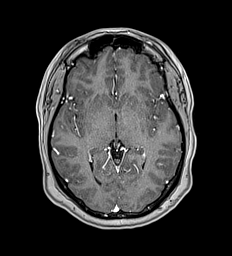
[im 80/144]
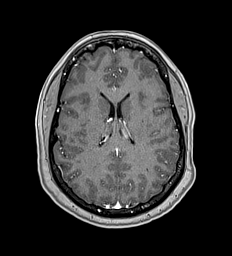
[im 96/144]
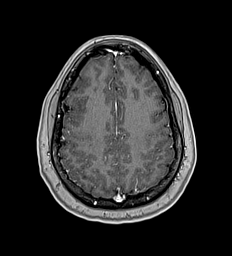
[im 128/144]
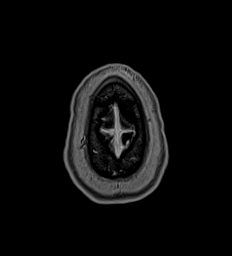
[im 144/144]
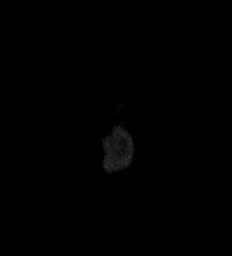

[35 of 48 positions shown; findings below may reference images not displayed]

FINDINGS: Brain: Ventricle size and cerebral volume normal. Negative for acute
infarct. Negative for hemorrhage or mass.

Mild white matter changes. Mild periventricular white matter
hyperintensities well as several small deep white matter
hyperintensities. This is most prominent in the right frontal
parietal deep white matter. Brainstem and cerebellum intact

IAC protocol. No mass in the posterior fossa. Seventh and eighth
cranial nerves normal.

Vascular: Normal arterial flow voids.

Skull and upper cervical spine: Negative

Sinuses/Orbits: Mild mucosal edema paranasal sinuses. Negative orbit

Other: None
IMPRESSION: Mild white matter changes with periventricular deep white matter
hyperintensities. Possible multiple sclerosis. Other possibilities
include chronic ischemia and other chronic white matter inflammatory
disorders.

No acute infarct or mass.

## 2020-06-01 MED ORDER — GADOBUTROL 1 MMOL/ML IV SOLN
10.0000 mL | Freq: Once | INTRAVENOUS | Status: AC | PRN
  Administered 2020-06-01: 10 mL via INTRAVENOUS

## 2020-06-11 ENCOUNTER — Other Ambulatory Visit: Payer: Self-pay | Admitting: Obstetrics and Gynecology

## 2020-07-02 ENCOUNTER — Other Ambulatory Visit: Payer: Self-pay | Admitting: Neurology

## 2020-07-06 ENCOUNTER — Other Ambulatory Visit: Payer: Self-pay | Admitting: Neurology

## 2020-07-06 DIAGNOSIS — R200 Anesthesia of skin: Secondary | ICD-10-CM

## 2020-07-06 DIAGNOSIS — R202 Paresthesia of skin: Secondary | ICD-10-CM

## 2020-07-21 ENCOUNTER — Other Ambulatory Visit: Payer: Self-pay

## 2020-07-21 ENCOUNTER — Ambulatory Visit
Admission: RE | Admit: 2020-07-21 | Discharge: 2020-07-21 | Disposition: A | Discharge status: Home / Self Care | Payer: Managed Care, Other (non HMO) | Source: Ambulatory Visit | Attending: Neurology | Admitting: Neurology

## 2020-07-21 DIAGNOSIS — R200 Anesthesia of skin: Secondary | ICD-10-CM

## 2020-07-21 DIAGNOSIS — R202 Paresthesia of skin: Secondary | ICD-10-CM

## 2020-07-21 IMAGING — MR MR CERVICAL SPINE WO/W CM
5 of 8 series · 27 of 48 positions shown · IV contrast (multihance)
Comparison: None.

CLINICAL DATA: Numbness and tingling.

EXAM:
MRI CERVICAL SPINE WITHOUT AND WITH CONTRAST
TECHNIQUE: Multiplanar and multiecho pulse sequences of the cervical spine, to
include the craniocervical junction and cervicothoracic junction,
were obtained without and with intravenous contrast.
CONTRAST:  20mL MULTIHANCE GADOBENATE DIMEGLUMINE 529 MG/ML IV SOLN

[Series 5: T1 · sagittal · 3.0mm · 0.66mm/px · 4 of 15 slices shown (1 of 3)]
[im 1/15]
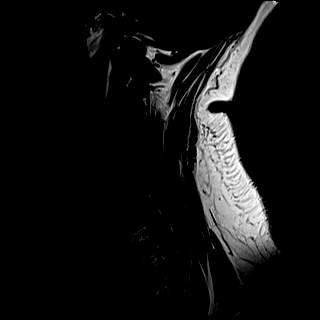
[im 5/15]
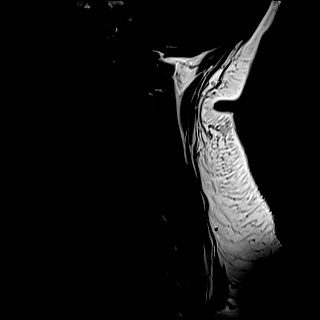
[im 10/15]
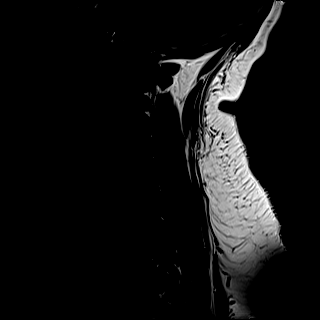
[im 15/15]
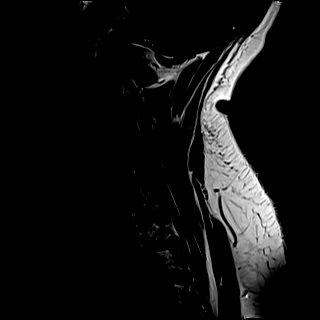

[Series 7: T2 · axial · 3.0mm · 0.50mm/px · z∈[-34,+63]mm · 8 of 32 slices shown (1 of 2)]
[im 1/32]
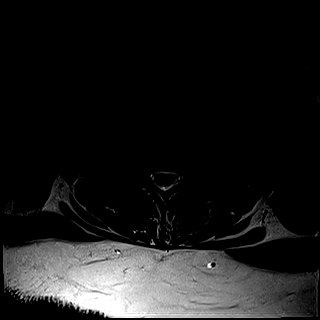
[im 5/32]
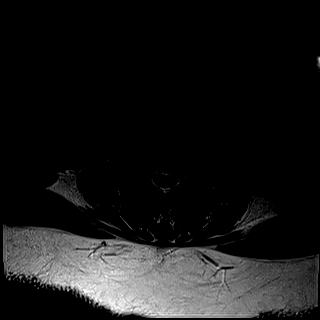
[im 9/32]
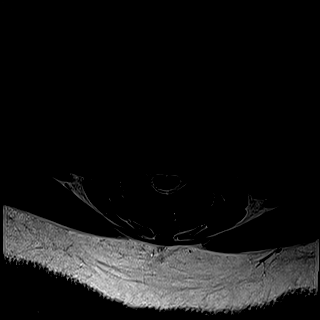
[im 14/32]
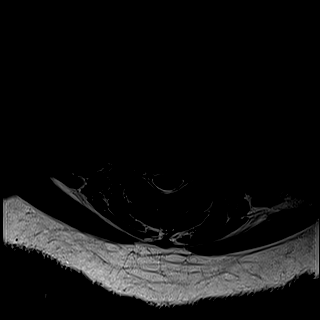
[im 18/32]
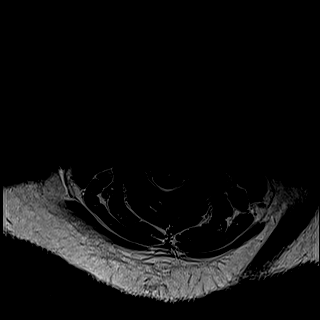
[im 23/32]
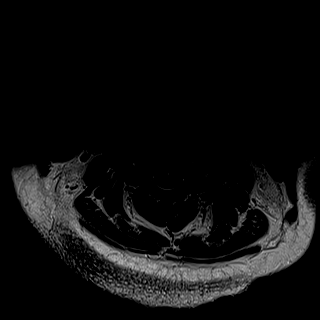
[im 27/32]
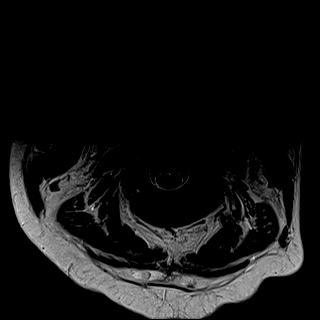
[im 32/32]
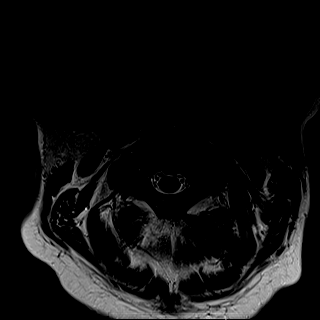

[Series 9: T1 · axial · non-contrast · 3.0mm · 0.31mm/px · z∈[-31,+65]mm · 8 of 32 slices shown (2 of 3)]
[im 1/32]
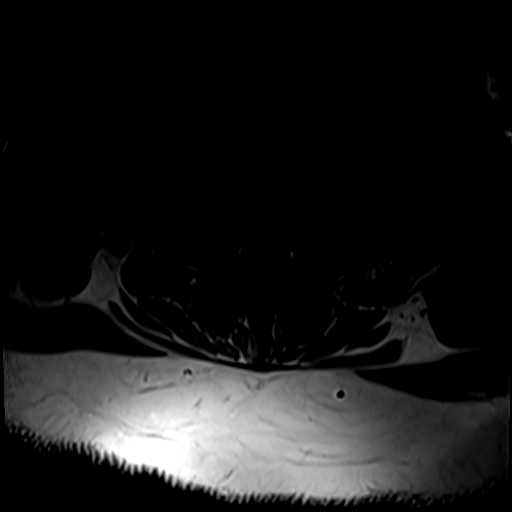
[im 5/32]
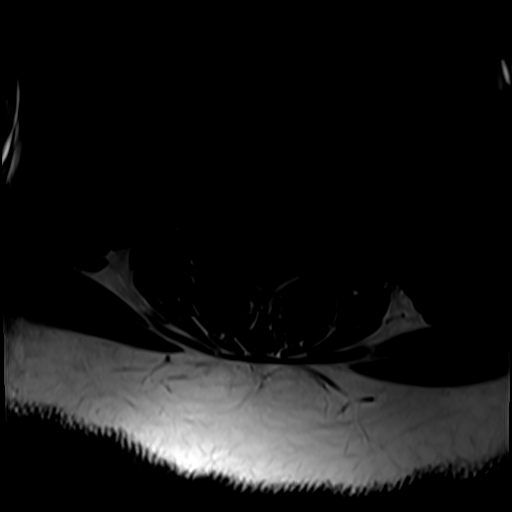
[im 9/32]
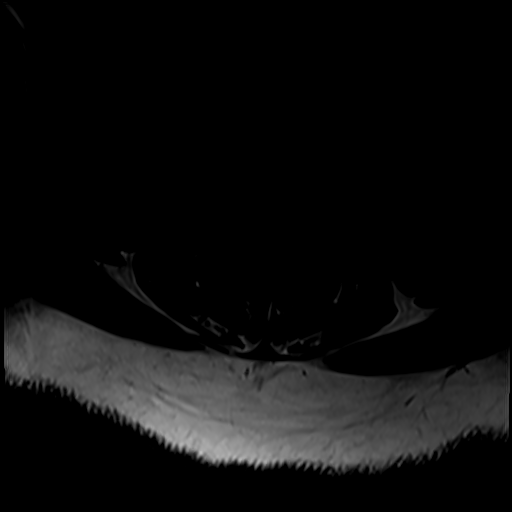
[im 14/32]
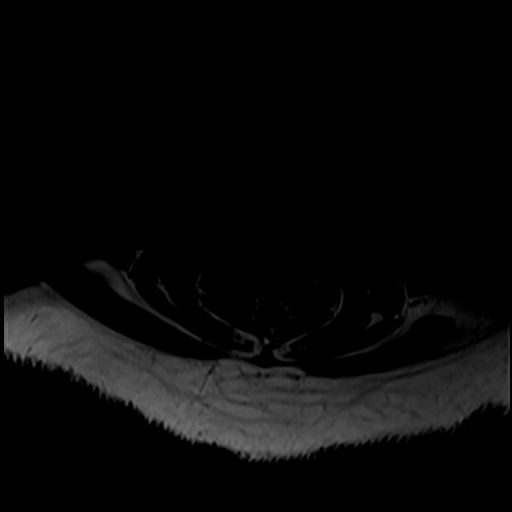
[im 18/32]
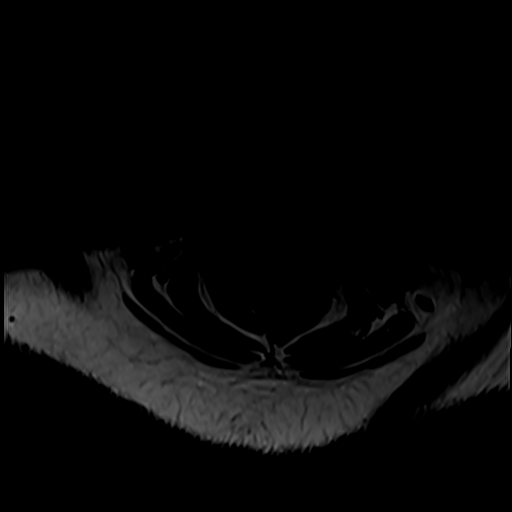
[im 23/32]
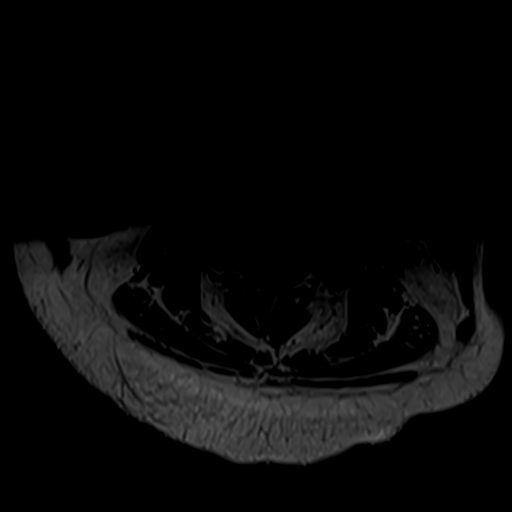
[im 27/32]
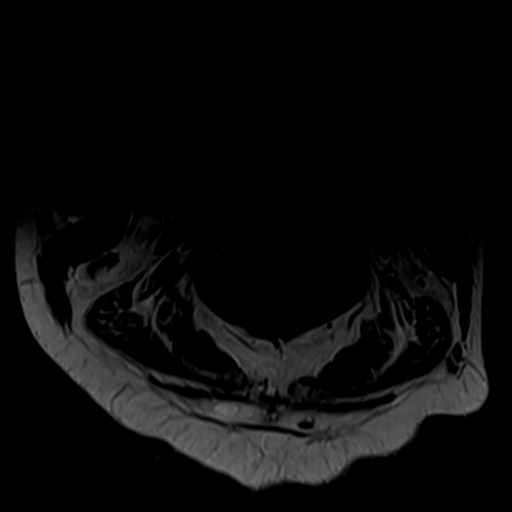
[im 32/32]
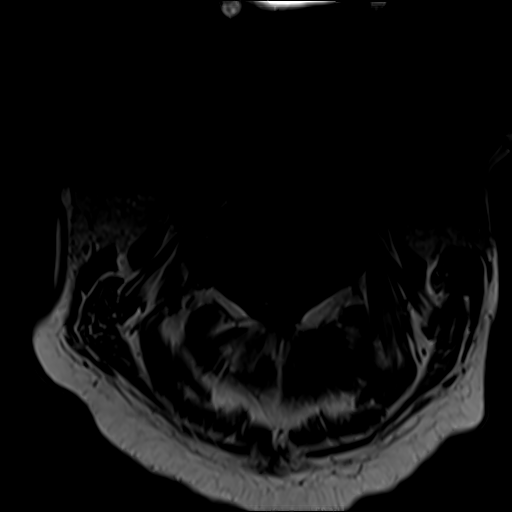

[Series 10: T2 · sagittal · 3.0mm · 0.55mm/px · 4 of 15 slices shown (2 of 2)]
[im 1/15]
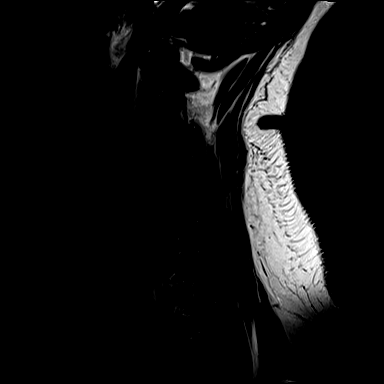
[im 5/15]
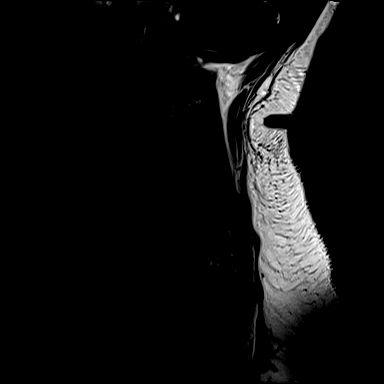
[im 10/15]
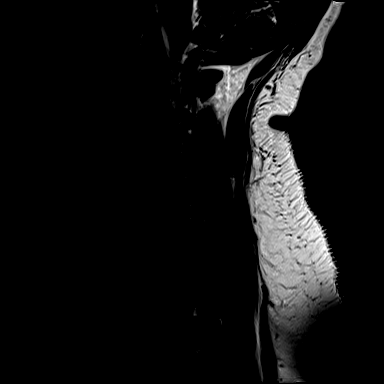
[im 15/15]
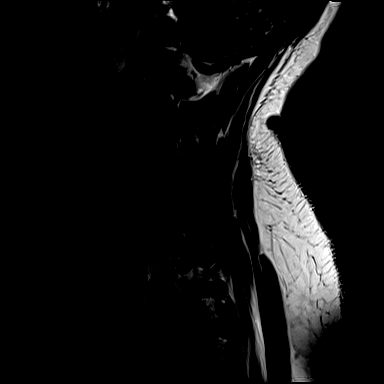

[Series 12: T1 · axial · 3.0mm · 0.31mm/px · z∈[-31,-6]mm · 3 of 32 slices shown (3 of 3)]
[im 1/32]
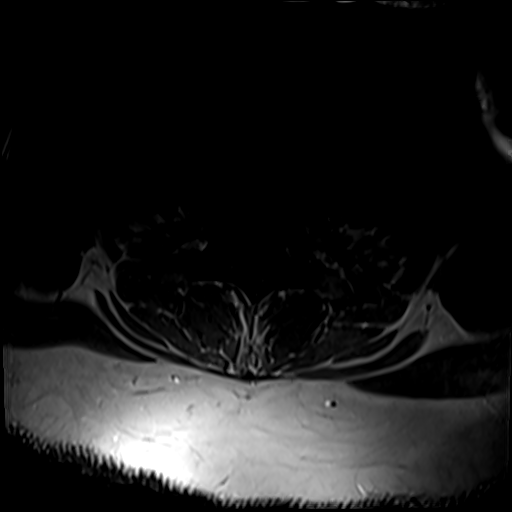
[im 5/32]
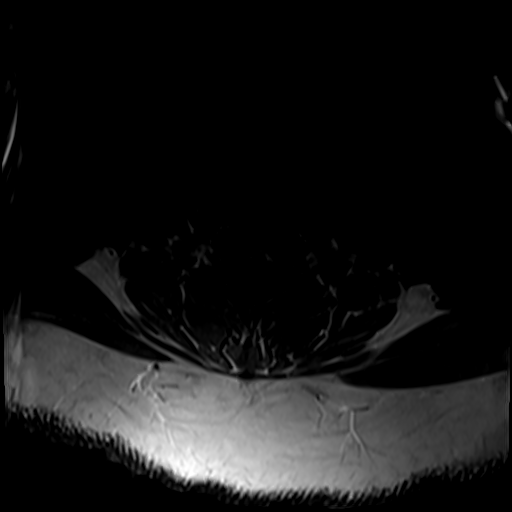
[im 9/32]
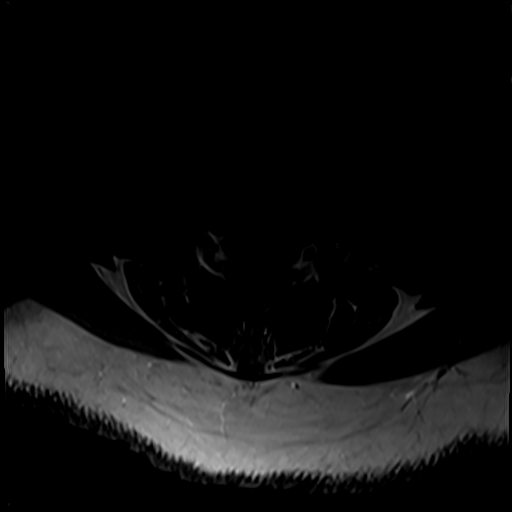

[27 of 48 positions shown; findings below may reference images not displayed]

FINDINGS: Alignment: Reversal of the normal cervical lordosis. Slight
anterolisthesis of C5 on C6.

Vertebrae: Vertebral body heights are maintained. No specific
evidence of acute fracture or discitis/osteomyelitis. No suspicious
bone lesions.

Cord: There is short-segment T2 hyperintensity within the dorsal
lateral right cord at C3-C4 (series [DATE], image 11 and series 6,
image 7) and the dorsal lateral left cord at C6-C7 (series [DATE],
image 26 and series 6, image 8). Potential additional site of T2
cord hyperintensity at T1-T2, only imaged sagittally (series 6,
image 8). These lesions are non expansile. No evidence of abnormal
cord enhancement on the axial postcontrast imaging. The sagittal
postcontrast imaging is limited by artifact.

Posterior Fossa, vertebral arteries, paraspinal tissues: Visualized
vertebral artery flow voids are maintained. The posterior fossa was
better evaluated on recent MRI brain from [DATE].

Disc levels:

C2-C3: No significant disc protrusion, foraminal stenosis, or canal
stenosis.

C3-C4: Small central disc protrusion without significant canal or
foraminal stenosis.

C4-C5: No significant disc protrusion, foraminal stenosis, or canal
stenosis.

C5-C6: No significant disc protrusion, foraminal stenosis, or canal
stenosis.

C6-C7: No significant disc protrusion, foraminal stenosis, or canal
stenosis.

C7-T1: No significant disc protrusion, foraminal stenosis, or canal
stenosis.
IMPRESSION: Multiple short-segment T2 hyperintense cord lesions, which are
suggestive of demyelination (particularly given the findings on
recent MRI head from [DATE]). No cord enhancement to suggest
active demyelination.

## 2020-07-21 MED ORDER — GADOBENATE DIMEGLUMINE 529 MG/ML IV SOLN
20.0000 mL | Freq: Once | INTRAVENOUS | Status: AC | PRN
  Administered 2020-07-21: 20 mL via INTRAVENOUS

## 2020-07-30 ENCOUNTER — Other Ambulatory Visit: Payer: Self-pay | Admitting: Neurology

## 2020-07-30 DIAGNOSIS — G35 Multiple sclerosis: Secondary | ICD-10-CM

## 2020-08-03 ENCOUNTER — Ambulatory Visit
Admission: RE | Admit: 2020-08-03 | Discharge: 2020-08-03 | Disposition: A | Discharge status: Home / Self Care | Payer: Managed Care, Other (non HMO) | Source: Ambulatory Visit | Attending: Neurology | Admitting: Neurology

## 2020-08-03 ENCOUNTER — Other Ambulatory Visit: Payer: Self-pay

## 2020-08-03 DIAGNOSIS — G35 Multiple sclerosis: Secondary | ICD-10-CM | POA: Insufficient documentation

## 2020-08-03 IMAGING — MR MR THORACIC SPINE WO/W CM
7 of 9 series · 30 of 48 positions shown · IV contrast (gadavist)
Comparison: MRI cervical spine [DATE].

CLINICAL DATA: Episodes of dizziness and difficulty with balance.
Question multiple sclerosis.

EXAM:
MRI THORACIC WITHOUT AND WITH CONTRAST
TECHNIQUE: Multiplanar and multiecho pulse sequences of the thoracic spine were
obtained without and with intravenous contrast.
CONTRAST:  10 mL GADAVIST IV SOLN

[Series 16: T1 · sagittal · 5.0mm · 1.88mm/px · 1 of 9 slices shown (1 of 3)]
[im 1/9]
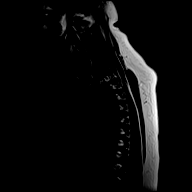

[Series 17: T2 · sagittal · 3.0mm · 1.06mm/px · 3 of 17 slices shown (1 of 2)]
[im 1/17]
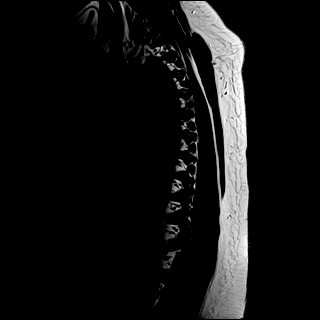
[im 9/17]
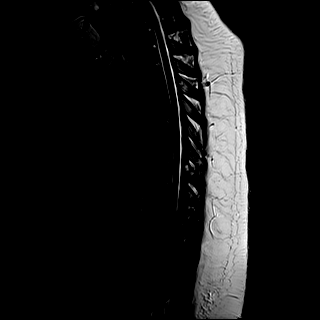
[im 17/17]
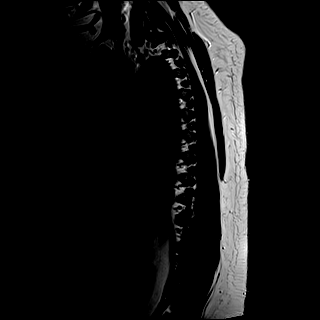

[Series 18: T1 · sagittal · 3.0mm · 1.06mm/px · 4 of 17 slices shown (2 of 3)]
[im 1/17]
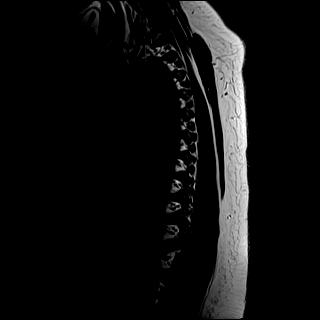
[im 6/17]
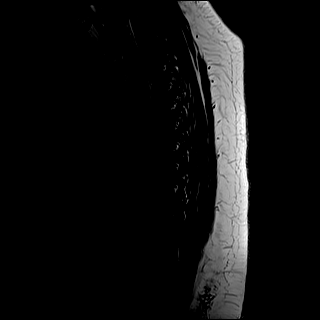
[im 11/17]
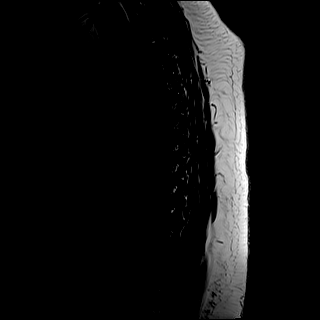
[im 17/17]
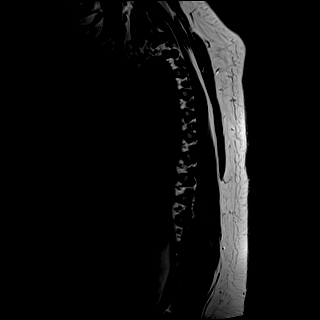

[Series 19: STIR · sagittal · 3.0mm · 0.53mm/px · 2 of 17 slices shown]
[im 1/17]
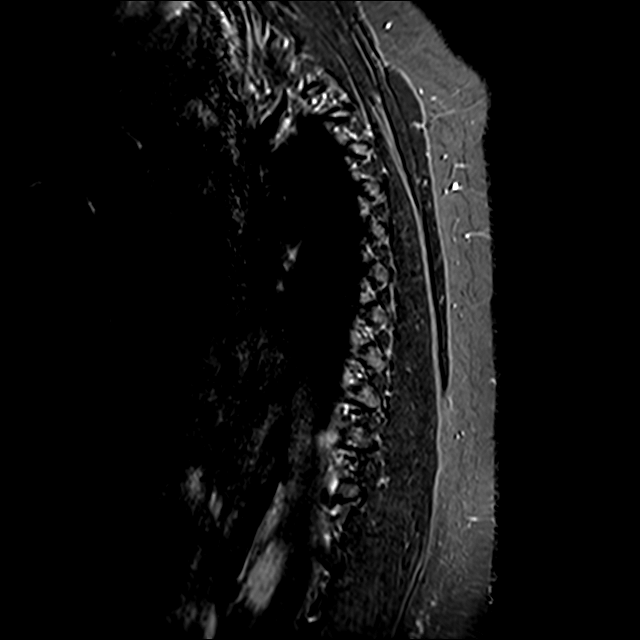
[im 6/17]
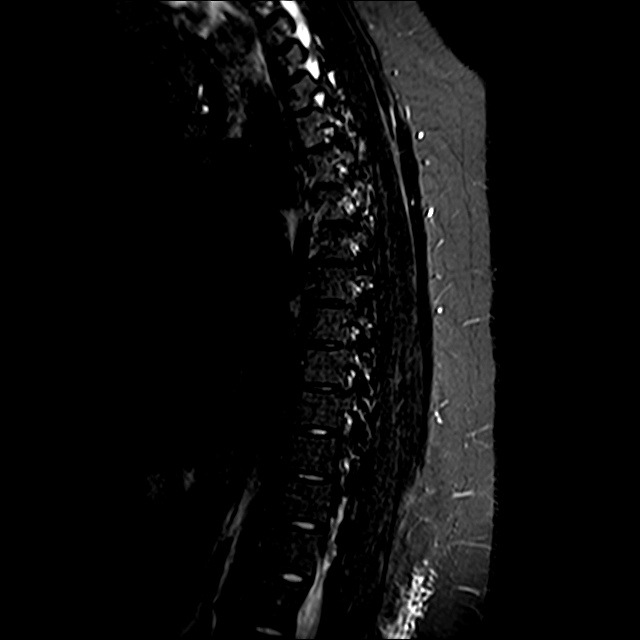

[Series 20: T2 · axial · 4.0mm · 0.59mm/px · z∈[-325,-94]mm · 8 of 39 slices shown (2 of 2)]
[im 1/39]
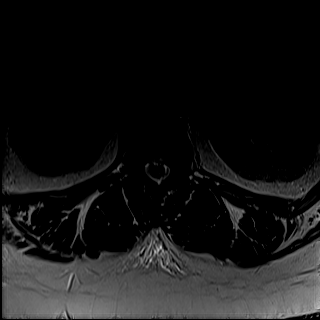
[im 6/39]
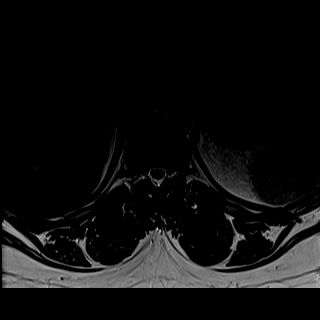
[im 11/39]
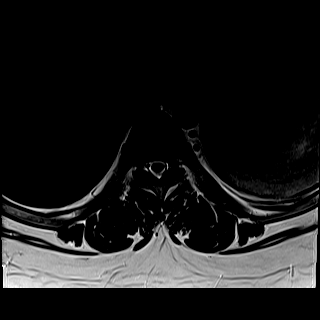
[im 17/39]
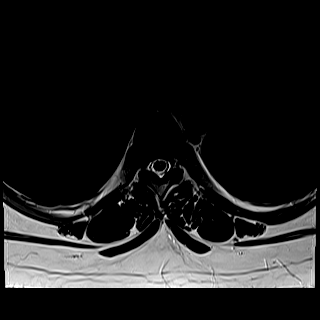
[im 22/39]
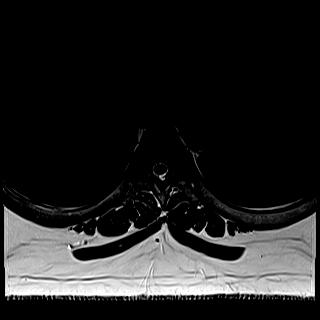
[im 28/39]
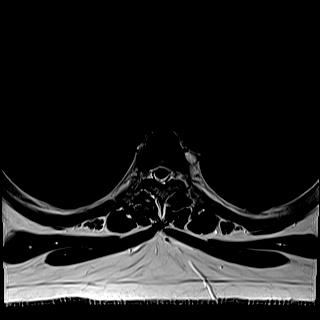
[im 33/39]
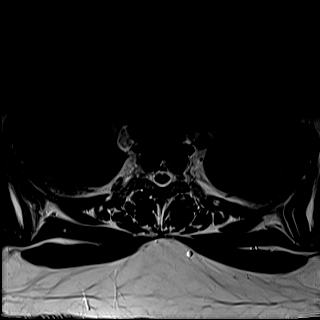
[im 39/39]
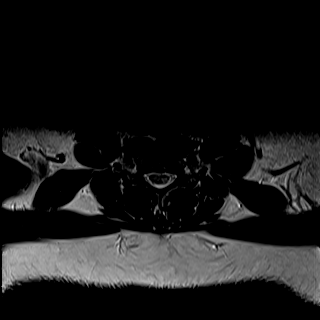

[Series 22: T1 · axial · non-contrast · 4.0mm · 0.37mm/px · z∈[-325,-94]mm · 8 of 39 slices shown (3 of 3)]
[im 1/39]
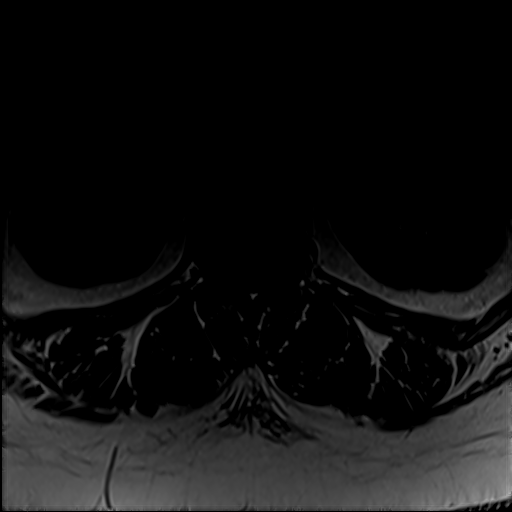
[im 6/39]
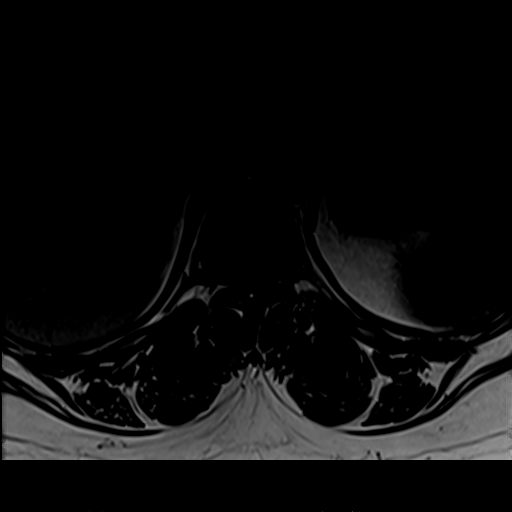
[im 11/39]
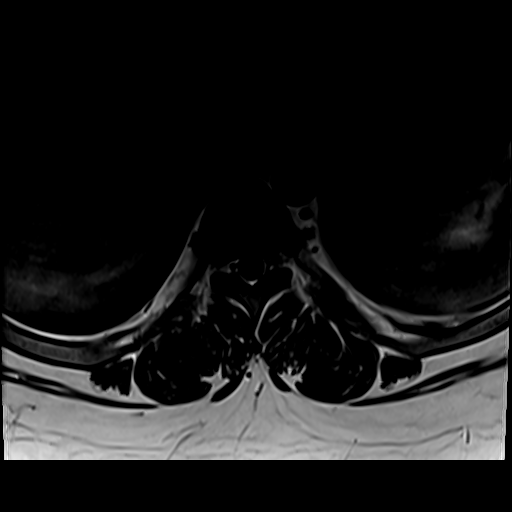
[im 17/39]
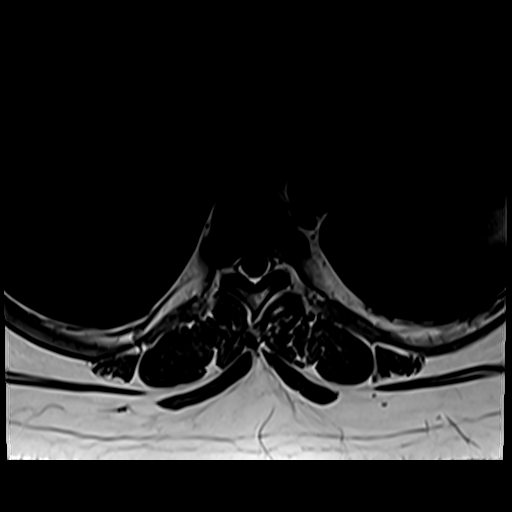
[im 22/39]
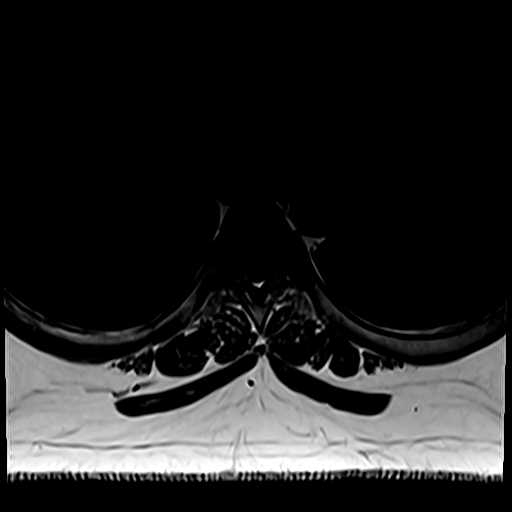
[im 28/39]
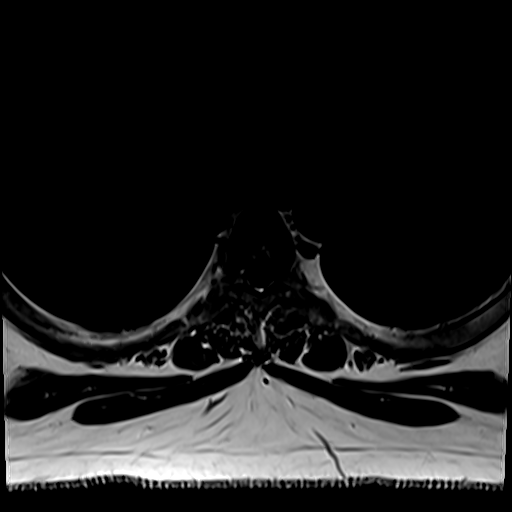
[im 33/39]
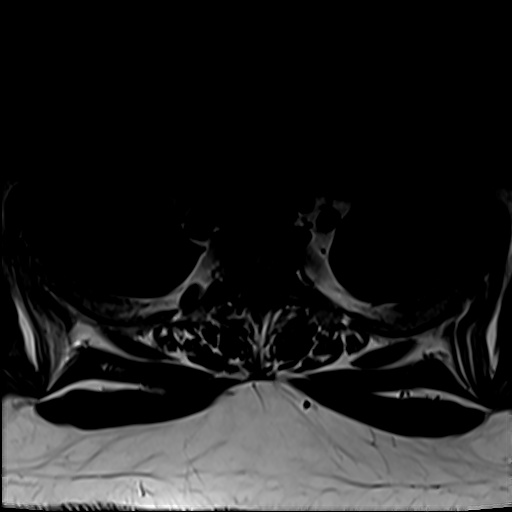
[im 39/39]
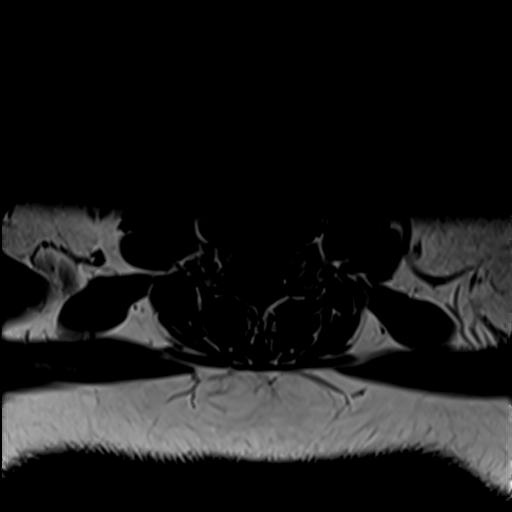

[Series 24: T1 fat-sat post-contrast · sagittal · 3.0mm · 1.06mm/px · 4 of 17 slices shown]
[im 1/17]
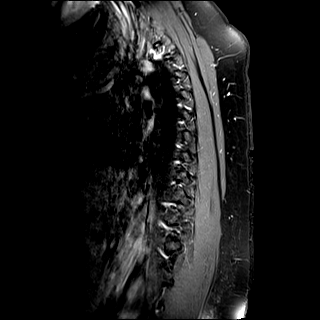
[im 6/17]
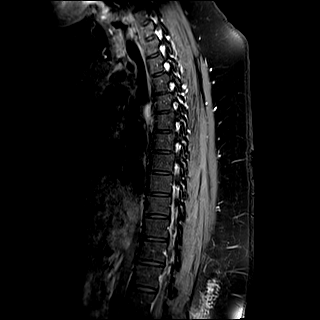
[im 11/17]
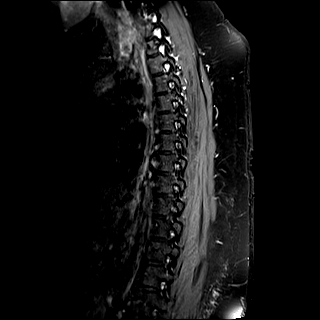
[im 17/17]
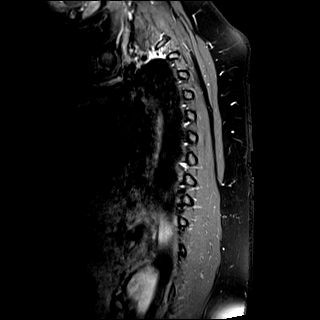

[30 of 48 positions shown; findings below may reference images not displayed]

FINDINGS: Alignment:  Normal.

Vertebrae: No fracture, evidence of discitis, or bone lesion.

Cord: Multiple foci of signal abnormality are seen within the cord.
Lesions at C6-7 and T1-2 are noted as seen on prior cervical spine
MRI. Additional foci of cord signal abnormality are most conspicuous
on the right hemicord at the T3-4 level, central aspect of the cord
at the level of the superior endplate of T7, T7-8 and T8-9. None of
these lesions demonstrate postcontrast enhancement.

Paraspinal and other soft tissues: Negative.

Disc levels:

Disc height and hydration are maintained at all levels. No stenosis.
IMPRESSION: Lesions scattered in the thoracic cord most consistent with
demyelinating disease. None of the lesions enhance.

Negative for degenerative change. The central canal and foramina are
widely patent at all levels.

## 2020-08-03 MED ORDER — GADOBUTROL 1 MMOL/ML IV SOLN
10.0000 mL | Freq: Once | INTRAVENOUS | Status: AC | PRN
  Administered 2020-08-03: 10 mL via INTRAVENOUS

## 2020-08-11 DIAGNOSIS — G35D Multiple sclerosis, unspecified: Secondary | ICD-10-CM | POA: Insufficient documentation

## 2020-08-11 DIAGNOSIS — G35 Multiple sclerosis: Secondary | ICD-10-CM | POA: Insufficient documentation

## 2020-09-17 ENCOUNTER — Ambulatory Visit (INDEPENDENT_AMBULATORY_CARE_PROVIDER_SITE_OTHER): Payer: Managed Care, Other (non HMO) | Admitting: Obstetrics and Gynecology

## 2020-09-17 ENCOUNTER — Other Ambulatory Visit: Payer: Self-pay

## 2020-09-17 ENCOUNTER — Encounter: Payer: Self-pay | Admitting: Obstetrics and Gynecology

## 2020-09-17 VITALS — BP 126/92 | HR 75 | Ht 62.0 in | Wt 250.0 lb

## 2020-09-17 DIAGNOSIS — Z1239 Encounter for other screening for malignant neoplasm of breast: Secondary | ICD-10-CM

## 2020-09-17 DIAGNOSIS — Z01419 Encounter for gynecological examination (general) (routine) without abnormal findings: Secondary | ICD-10-CM

## 2020-09-17 MED ORDER — NYSTATIN 100000 UNIT/GM EX CREA: 1.0000 "application " | TOPICAL_CREAM | Freq: Two times a day (BID) | CUTANEOUS | 1 refills | Status: DC

## 2020-09-17 NOTE — Progress Notes (Signed)
Gynecology Annual Exam   PCP: [facility], [place]  Chief Complaint: No chief complaint on file.   History of Present Illness: Patient is a 30 y.o. G0P0000 presents for annual exam. The patient has no complaints today.   LMP: No LMP recorded. (Menstrual status: Irregular Periods). Average Interval: regular monthly Duration of flow: 5 days Heavy Menses: no Clots: no Intermenstrual Bleeding: no Postcoital Bleeding: no Dysmenorrhea: no  The patient is sexually active. She currently uses condoms for contraception. She denies dyspareunia.  The patient does perform self breast exams.  There is no notable family history of breast or ovarian cancer in her family.  The patient wears seatbelts: yes.   The patient has regular exercise: not asked.    The patient denies current symptoms of depression.    Review of Systems: Review of Systems  Constitutional:  Negative for chills and fever.  HENT:  Negative for congestion.   Respiratory:  Negative for cough and shortness of breath.   Cardiovascular:  Negative for chest pain and palpitations.  Gastrointestinal:  Negative for abdominal pain, constipation, diarrhea, heartburn, nausea and vomiting.  Genitourinary:  Negative for dysuria, frequency and urgency.  Skin:  Negative for itching and rash.  Neurological:  Negative for dizziness and headaches.  Endo/Heme/Allergies:  Negative for polydipsia.  Psychiatric/Behavioral:  Negative for depression.    Past Medical History:  Patient Active Problem List   Diagnosis Date Noted   BV (bacterial vaginosis) 04/30/2020   COVID-19 08/06/2019   Obesity, Class III, BMI 40-49.9 (morbid obesity) (HCC) 08/06/2019   COVID-19 virus infection 08/06/2019    Past Surgical History:  Past Surgical History:  Procedure Laterality Date   TONSILLECTOMY      Gynecologic History:  No LMP recorded. (Menstrual status: Irregular Periods). Contraception: condoms Last Pap: Results were: 04/22/2018  NILM  Obstetric History: G0P0000  Family History:  Family History  Problem Relation Age of Onset   Prostate cancer Father 96   Breast cancer Maternal Grandmother 23    Social History:  Social History   Socioeconomic History   Marital status: Single    Spouse name: Not on file   Number of children: Not on file   Years of education: Not on file   Highest education level: Not on file  Occupational History   Not on file  Tobacco Use   Smoking status: Never   Smokeless tobacco: Never  Vaping Use   Vaping Use: Never used  Substance and Sexual Activity   Alcohol use: No   Drug use: No   Sexual activity: Yes    Birth control/protection: None, Condom  Other Topics Concern   Not on file  Social History Narrative   Not on file   Social Determinants of Health   Financial Resource Strain: Not on file  Food Insecurity: Not on file  Transportation Needs: Not on file  Physical Activity: Not on file  Stress: Not on file  Social Connections: Not on file  Intimate Partner Violence: Not on file    Allergies:  No Known Allergies  Medications: Prior to Admission medications   Medication Sig Start Date End Date Taking? Authorizing Provider  medroxyPROGESTERone (PROVERA) 10 MG tablet Take 1 tablet (10 mg total) by mouth daily for 7 days. 05/02/20 05/09/20  Copland, Ilona Sorrel, PA-C    Physical Exam Vitals:  Blood pressure (!) 126/92, pulse 75, height 5\' 2"  (1.575 m), weight 250 lb (113.4 kg).   General: NAD HEENT: normocephalic, anicteric Thyroid:  no enlargement, no palpable nodules Pulmonary: No increased work of breathing, CTAB Cardiovascular: RRR, distal pulses 2+ Breast: Breast symmetrical, no tenderness, no palpable nodules or masses, no skin or nipple retraction present, no nipple discharge.  No axillary or supraclavicular lymphadenopathy. Abdomen: NABS, soft, non-tender, non-distended.  Umbilicus without lesions.  No hepatomegaly, splenomegaly or masses palpable. No  evidence of hernia  Genitourinary:  External: Normal external female genitalia.  Normal urethral meatus, normal Bartholin's and Skene's glands.    Vagina: Normal vaginal mucosa, no evidence of prolapse.    Cervix: Grossly normal in appearance, no bleeding  Uterus: Non-enlarged, mobile, normal contour.  No CMT  Adnexa: ovaries non-enlarged, no adnexal masses  Rectal: deferred  Lymphatic: no evidence of inguinal lymphadenopathy Extremities: no edema, erythema, or tenderness Neurologic: Grossly intact Psychiatric: mood appropriate, affect full  Female chaperone present for pelvic and breast  portions of the physical exam    Assessment: 30 y.o. G0P0000 routine annual exam  Plan: Problem List Items Addressed This Visit   None   1) STI screening  was notoffered and therefore not obtained  2)  ASCCP guidelines and rational discussed.  Patient opts for every 3 years screening interval  3) Contraception - the patient is currently using  condoms.  She is happy with her current form of contraception and plans to continue  4) Routine healthcare maintenance including cholesterol, diabetes screening discussed managed by PCP  5) Return in about 1 year (around 09/17/2021) for annual.   Vena Austria, MD, [facility], [facility] 09/17/2020, 1:21 PM

## 2020-10-12 ENCOUNTER — Encounter: Payer: Self-pay | Admitting: Advanced Practice Midwife

## 2020-10-12 ENCOUNTER — Ambulatory Visit (INDEPENDENT_AMBULATORY_CARE_PROVIDER_SITE_OTHER): Payer: Managed Care, Other (non HMO) | Admitting: Advanced Practice Midwife

## 2020-10-12 ENCOUNTER — Other Ambulatory Visit (HOSPITAL_COMMUNITY)
Admission: RE | Admit: 2020-10-12 | Discharge: 2020-10-12 | Disposition: A | Discharge status: Home / Self Care | Payer: Managed Care, Other (non HMO) | Source: Ambulatory Visit | Attending: Advanced Practice Midwife | Admitting: Advanced Practice Midwife

## 2020-10-12 ENCOUNTER — Other Ambulatory Visit: Payer: Self-pay

## 2020-10-12 VITALS — BP 118/72 | Ht 62.0 in | Wt 256.0 lb

## 2020-10-12 DIAGNOSIS — Z113 Encounter for screening for infections with a predominantly sexual mode of transmission: Secondary | ICD-10-CM

## 2020-10-12 DIAGNOSIS — R102 Pelvic and perineal pain: Secondary | ICD-10-CM | POA: Diagnosis not present

## 2020-10-12 NOTE — Progress Notes (Signed)
Patient ID: Angelica Robles, female   DOB: 1990/02/17, 30 y.o.   MRN: [id number]  Reason for Consult: Abdominal Pain (Lower Lt abdomen pain x 1wk. RM 5)   Subjective:  HPI:  Angelica Robles is a 30 y.o. female being seen for lower right pelvic pain for the past week. The pain is crampy and was constant for the first 3 days. It is intermittent since then. She had unprotected intercourse 2 weeks ago and requests testing for STDs/vaginitis. She has intermittent itching. She denies irritation, odor, discharge, urinary symptoms. She had spotting for 1 day recently and otherwise she has not had a period for the past year. She was diagnosed with MS in July of this year. She also has PCOS. She does not want to have children and is interested in having a hysterectomy. Annual exam was 3 weeks ago.  Past Medical History:  Diagnosis Date   Oligomenorrhea    Family History  Problem Relation Age of Onset   Prostate cancer Father 34   Breast cancer Maternal Grandmother 14   Past Surgical History:  Procedure Laterality Date   TONSILLECTOMY      Short Social History:  Social History   Tobacco Use   Smoking status: Never   Smokeless tobacco: Never  Substance Use Topics   Alcohol use: No    No Known Allergies  Current Outpatient Medications  Medication Sig Dispense Refill   Cholecalciferol 25 MCG (1000 UT) capsule Take by mouth.     KESIMPTA 20 MG/0.4ML SOAJ Inject into the skin.     nystatin cream (MYCOSTATIN) Apply 1 application topically 2 (two) times daily. 30 g 1   No current facility-administered medications for this visit.   Review of Systems  Constitutional:  Negative for chills and fever.  HENT:  Negative for congestion, ear discharge, ear pain, hearing loss, sinus pain and sore throat.   Eyes:  Negative for blurred vision and double vision.  Respiratory:  Negative for cough, shortness of breath and wheezing.   Cardiovascular:  Negative for chest pain, palpitations and leg  swelling.  Gastrointestinal:  Positive for abdominal pain. Negative for blood in stool, constipation, diarrhea, heartburn, melena, nausea and vomiting.  Genitourinary:  Negative for dysuria, flank pain, frequency, hematuria and urgency.  Musculoskeletal:  Negative for back pain, joint pain and myalgias.  Skin:  Negative for itching and rash.  Neurological:  Negative for dizziness, tingling, tremors, sensory change, speech change, focal weakness, seizures, loss of consciousness, weakness and headaches.  Endo/Heme/Allergies:  Negative for environmental allergies. Does not bruise/bleed easily.  Psychiatric/Behavioral:  Negative for depression, hallucinations, memory loss, substance abuse and suicidal ideas. The patient is not nervous/anxious and does not have insomnia.        Objective:  Objective   Vitals:   10/12/20 1420  BP: 118/72  Weight: 256 lb (116.1 kg)  Height: 5\' 2"  (1.575 m)   Body mass index is 46.82 kg/m. Constitutional: Well nourished, well developed female in no acute distress.  HEENT: normal Skin: Warm and dry.  Cardiovascular: Regular rate and rhythm.   Respiratory: Clear to auscultation bilateral. Normal respiratory effort Abdomen: non-tender to palpation Neuro: DTRs 2+, Cranial nerves grossly intact Psych: Alert and Oriented x3. No memory deficits. Normal mood and affect.   Pelvic exam:  is limited by body habitus EGBUS: within normal limits Vagina: within normal limits and with normal mucosa, scant thin discharge    Assessment/Plan:     29 y.o. G0 P0 female with  pelvic pain, requesting STD and vaginitis testing  Aptima swab Follow up as needed after lab result Follow up with MD regarding hysterectomy   Tresea Mall CNM [facility] 10/12/2020, 2:57 PM

## 2020-10-14 ENCOUNTER — Other Ambulatory Visit: Payer: Self-pay | Admitting: Advanced Practice Midwife

## 2020-10-14 DIAGNOSIS — B3731 Acute candidiasis of vulva and vagina: Secondary | ICD-10-CM

## 2020-10-14 DIAGNOSIS — B9689 Other specified bacterial agents as the cause of diseases classified elsewhere: Secondary | ICD-10-CM

## 2020-10-14 LAB — CERVICOVAGINAL ANCILLARY ONLY
Bacterial Vaginitis (gardnerella): POSITIVE — AB
Candida Glabrata: NEGATIVE
Candida Vaginitis: NEGATIVE
Chlamydia: NEGATIVE
Comment: NEGATIVE
Comment: NEGATIVE
Comment: NEGATIVE
Comment: NEGATIVE
Comment: NEGATIVE
Comment: NORMAL
Neisseria Gonorrhea: NEGATIVE
Trichomonas: NEGATIVE

## 2020-10-14 MED ORDER — METRONIDAZOLE 500 MG PO TABS: 500.0000 mg | ORAL_TABLET | Freq: Two times a day (BID) | ORAL | 0 refills | Status: AC

## 2020-10-14 MED ORDER — FLUCONAZOLE 150 MG PO TABS: 150.0000 mg | ORAL_TABLET | Freq: Once | ORAL | 3 refills | Status: AC

## 2020-10-14 NOTE — Progress Notes (Signed)
Rx metronidazole to treat BV. Diflucan sent in case yeast develops. Message to patient.

## 2021-09-20 ENCOUNTER — Other Ambulatory Visit (HOSPITAL_COMMUNITY)
Admission: RE | Admit: 2021-09-20 | Discharge: 2021-09-20 | Disposition: A | Discharge status: Home / Self Care | Payer: Managed Care, Other (non HMO) | Source: Ambulatory Visit | Attending: Obstetrics and Gynecology | Admitting: Obstetrics and Gynecology

## 2021-09-20 ENCOUNTER — Ambulatory Visit (INDEPENDENT_AMBULATORY_CARE_PROVIDER_SITE_OTHER): Payer: Managed Care, Other (non HMO) | Admitting: Obstetrics and Gynecology

## 2021-09-20 ENCOUNTER — Encounter: Payer: Self-pay | Admitting: Obstetrics and Gynecology

## 2021-09-20 VITALS — BP 120/70 | Ht 61.0 in | Wt 257.0 lb

## 2021-09-20 DIAGNOSIS — Z1151 Encounter for screening for human papillomavirus (HPV): Secondary | ICD-10-CM

## 2021-09-20 DIAGNOSIS — Z124 Encounter for screening for malignant neoplasm of cervix: Secondary | ICD-10-CM | POA: Diagnosis present

## 2021-09-20 DIAGNOSIS — Z713 Dietary counseling and surveillance: Secondary | ICD-10-CM

## 2021-09-20 DIAGNOSIS — B3731 Acute candidiasis of vulva and vagina: Secondary | ICD-10-CM | POA: Diagnosis not present

## 2021-09-20 DIAGNOSIS — Z01419 Encounter for gynecological examination (general) (routine) without abnormal findings: Secondary | ICD-10-CM | POA: Diagnosis not present

## 2021-09-20 LAB — POCT WET PREP WITH KOH
Clue Cells Wet Prep HPF POC: NEGATIVE
KOH Prep POC: NEGATIVE
Trichomonas, UA: NEGATIVE
Yeast Wet Prep HPF POC: POSITIVE

## 2021-09-20 MED ORDER — FLUCONAZOLE 150 MG PO TABS: 150.0000 mg | ORAL_TABLET | Freq: Once | ORAL | 0 refills | Status: AC

## 2021-09-20 NOTE — Patient Instructions (Signed)
I value your feedback and you entrusting us with your care. If you get a [facility] patient survey, I would appreciate you taking the time to let us know about your experience today. Thank you! ? ? ?

## 2021-09-20 NOTE — Progress Notes (Signed)
PCP:  [facility] [place]   Chief Complaint  Patient presents with   Gynecologic Exam    Vaginal irritation x 2 weeks     HPI:      [name] is a 31 y.o. G0P0000 whose LMP was Patient's last menstrual period was 08/01/2021 (exact date)., presents today for her annual examination.  Her menses are every 2-3 months, normal for pt, lasting 3-5 days, mod flow, no BTB, mild dysmen, no meds needed. LMP lasted 2 wks and heavier with a little more cramping.   Sex activity: not currently active; no pain/bleeding when was active. Using condoms.  Last Pap: 04/22/18  Results were: no abnormalities  Has noticed a little vaginal irritation with urinating the past 2 wks, no increased vag d/c/odor. Hx of BV. Treated with 1 dose metrogel without relief; no changes in soaps/detergents. No recent abx use.   There is a FH of breast cancer in her MGM, genetic testing not indicated. There is no FH of ovarian cancer. The patient does do self-breast exams.  Tobacco use: The patient denies current or previous tobacco use. Alcohol use: none No drug use.  Exercise: not active  She does get adequate calcium and Vitamin D in her diet. Diagnosed with MS about a yr ago. Stable on meds. Needs to lose wt but doesn't have energy for exercise. Not doing diet changes. Would like wt loss ref.   Patient Active Problem List   Diagnosis Date Noted   MS (multiple sclerosis) (HCC) 08/11/2020   BV (bacterial vaginosis) 04/30/2020   COVID-19 08/06/2019   Obesity, Class III, BMI 40-49.9 (morbid obesity) (HCC) 08/06/2019   COVID-19 virus infection 08/06/2019    Past Surgical History:  Procedure Laterality Date   TONSILLECTOMY      Family History  Problem Relation Age of Onset   Prostate cancer Father 50   Breast cancer Maternal Grandmother 8    Social History   Socioeconomic History   Marital status: Single    Spouse name: Not on file   Number of children: Not on file   Years of  education: Not on file   Highest education level: Not on file  Occupational History   Not on file  Tobacco Use   Smoking status: Never   Smokeless tobacco: Never  Vaping Use   Vaping Use: Never used  Substance and Sexual Activity   Alcohol use: No   Drug use: No   Sexual activity: Not Currently    Birth control/protection: None  Other Topics Concern   Not on file  Social History Narrative   Not on file   Social Determinants of Health   Financial Resource Strain: Not on file  Food Insecurity: Not on file  Transportation Needs: Not on file  Physical Activity: Not on file  Stress: Not on file  Social Connections: Not on file  Intimate Partner Violence: Not on file     Current Outpatient Medications:    cyanocobalamin (VITAMIN B12) 1000 MCG tablet, Take by mouth., Disp: , Rfl:    fluconazole (DIFLUCAN) 150 MG tablet, Take 1 tablet (150 mg total) by mouth once for 1 dose., Disp: 1 tablet, Rfl: 0   KESIMPTA 20 MG/0.4ML SOAJ, Inject into the skin., Disp: , Rfl:    Vitamin D, Ergocalciferol, (DRISDOL) 1.25 MG (50000 UNIT) CAPS capsule, Take 50,000 Units by mouth once a week., Disp: , Rfl:      ROS:  Review of Systems  Constitutional:  Negative for  fatigue, fever and unexpected weight change.  Respiratory:  Negative for cough, shortness of breath and wheezing.   Cardiovascular:  Negative for chest pain, palpitations and leg swelling.  Gastrointestinal:  Negative for blood in stool, constipation, diarrhea, nausea and vomiting.  Endocrine: Negative for cold intolerance, heat intolerance and polyuria.  Genitourinary:  Negative for dyspareunia, dysuria, flank pain, frequency, genital sores, hematuria, menstrual problem, pelvic pain, urgency, vaginal bleeding, vaginal discharge and vaginal pain.  Musculoskeletal:  Negative for back pain, joint swelling and myalgias.  Skin:  Negative for rash.  Neurological:  Negative for dizziness, syncope, light-headedness, numbness and  headaches.  Hematological:  Negative for adenopathy.  Psychiatric/Behavioral:  Negative for agitation, confusion, sleep disturbance and suicidal ideas. The patient is not nervous/anxious.    BREAST: No symptoms   Objective: BP 120/70   Ht 5\' 1"  (1.549 m)   Wt 257 lb (116.6 kg)   LMP 08/01/2021 (Exact Date)   BMI 48.56 kg/m    Physical Exam Constitutional:      Appearance: She is well-developed.  Genitourinary:     Vulva normal.     Right Labia: No rash, tenderness or lesions.    Left Labia: No tenderness, lesions or rash.    Vaginal discharge present.     No vaginal erythema or tenderness.      Right Adnexa: not tender and no mass present.    Left Adnexa: not tender and no mass present.    No cervical friability or polyp.     Uterus is not enlarged or tender.  Breasts:    Right: No mass, nipple discharge, skin change or tenderness.     Left: No mass, nipple discharge, skin change or tenderness.  Neck:     Thyroid: No thyromegaly.  Cardiovascular:     Rate and Rhythm: Normal rate and regular rhythm.     Heart sounds: Normal heart sounds. No murmur heard. Pulmonary:     Effort: Pulmonary effort is normal.     Breath sounds: Normal breath sounds.  Abdominal:     Palpations: Abdomen is soft.     Tenderness: There is no abdominal tenderness. There is no guarding or rebound.  Musculoskeletal:        General: Normal range of motion.     Cervical back: Normal range of motion.  Lymphadenopathy:     Cervical: No cervical adenopathy.  Neurological:     General: No focal deficit present.     Mental Status: She is alert and oriented to person, place, and time.     Cranial Nerves: No cranial nerve deficit.  Skin:    General: Skin is warm and dry.  Psychiatric:        Mood and Affect: Mood normal.        Behavior: Behavior normal.        Thought Content: Thought content normal.        Judgment: Judgment normal.  Vitals reviewed.     Results: Results for orders placed  or performed in visit on 09/20/21 (from the past 24 hour(s))  POCT Wet Prep with KOH     Status: Abnormal   Collection Time: 09/20/21  3:44 PM  Result Value Ref Range   Trichomonas, UA Negative    Clue Cells Wet Prep HPF POC neg    Epithelial Wet Prep HPF POC     Yeast Wet Prep HPF POC pos    Bacteria Wet Prep HPF POC     RBC Wet Prep HPF POC  WBC Wet Prep HPF POC     KOH Prep POC Negative Negative    Assessment/Plan: Encounter for annual routine gynecological examination  Cervical cancer screening - Plan: Cytology - PAP  Screening for HPV (human papillomavirus) - Plan: Cytology - PAP  Candidal vaginitis - Plan: fluconazole (DIFLUCAN) 150 MG tablet, POCT Wet Prep with KOH; pos sx and wet prep. Rx diflucan, f/u prn.   Weight loss counseling, encounter for - Plan: Ambulatory referral to [facility]; refer to MD for wt mgmt. Try diet changes prior to appt/keep food journal.   Meds ordered this encounter  Medications   fluconazole (DIFLUCAN) 150 MG tablet    Sig: Take 1 tablet (150 mg total) by mouth once for 1 dose.    Dispense:  1 tablet    Refill:  0    Order Specific Question:   Supervising Provider    Answer:   Renaldo Reel             GYN counsel adequate intake of calcium and vitamin D, diet and exercise     F/U  Return in about 1 year (around 09/21/2022).  Enijah Furr B. Curry Seefeldt, PA-C 09/20/2021 3:45 PM

## 2021-09-22 LAB — CYTOLOGY - PAP
Comment: NEGATIVE
Diagnosis: NEGATIVE
High risk HPV: POSITIVE — AB

## 2021-09-23 ENCOUNTER — Telehealth: Payer: Self-pay

## 2021-09-23 NOTE — Telephone Encounter (Signed)
Pt calling; was seen Tues; Elmo Putt told her to let her know if rx didn't work. Pt calling for a diff rx.  [phone]  Pt aware Elmo Putt is not in the office today but is good to check her messsages; pharmacy correct in chart.

## 2021-09-24 ENCOUNTER — Other Ambulatory Visit: Payer: Self-pay | Admitting: Obstetrics and Gynecology

## 2021-09-24 MED ORDER — TERCONAZOLE 0.8 % VA CREA: 1.0000 | TOPICAL_CREAM | Freq: Every day | VAGINAL | 0 refills | Status: AC

## 2021-09-24 NOTE — Progress Notes (Signed)
Rx terazol for persisting yeast vag sx

## 2021-09-24 NOTE — Telephone Encounter (Signed)
Rx yeast crm eRxd. Takes a few days for diflucan to work but can do cream too.

## 2021-09-26 NOTE — Telephone Encounter (Signed)
Pt aware.

## 2021-10-05 ENCOUNTER — Encounter (INDEPENDENT_AMBULATORY_CARE_PROVIDER_SITE_OTHER): Payer: Managed Care, Other (non HMO) | Admitting: Family Medicine

## 2021-10-10 DIAGNOSIS — Z0289 Encounter for other administrative examinations: Secondary | ICD-10-CM

## 2021-10-13 ENCOUNTER — Encounter (INDEPENDENT_AMBULATORY_CARE_PROVIDER_SITE_OTHER): Payer: Self-pay | Admitting: Family Medicine

## 2021-10-13 ENCOUNTER — Ambulatory Visit (INDEPENDENT_AMBULATORY_CARE_PROVIDER_SITE_OTHER): Payer: Managed Care, Other (non HMO) | Admitting: Family Medicine

## 2021-10-13 VITALS — BP 114/78 | HR 108 | Temp 97.5°F | Ht 62.0 in | Wt 253.0 lb

## 2021-10-13 DIAGNOSIS — E282 Polycystic ovarian syndrome: Secondary | ICD-10-CM

## 2021-10-13 DIAGNOSIS — R5383 Other fatigue: Secondary | ICD-10-CM

## 2021-10-13 DIAGNOSIS — Z1331 Encounter for screening for depression: Secondary | ICD-10-CM

## 2021-10-13 DIAGNOSIS — R0602 Shortness of breath: Secondary | ICD-10-CM

## 2021-10-13 DIAGNOSIS — Z6841 Body Mass Index (BMI) 40.0 and over, adult: Secondary | ICD-10-CM | POA: Diagnosis not present

## 2021-10-13 DIAGNOSIS — E559 Vitamin D deficiency, unspecified: Secondary | ICD-10-CM

## 2021-10-14 LAB — COMPREHENSIVE METABOLIC PANEL
ALT: 17 IU/L (ref 0–32)
AST: 13 IU/L (ref 0–40)
Albumin/Globulin Ratio: 1.4 (ref 1.2–2.2)
Albumin: 4.4 g/dL (ref 3.9–4.9)
Alkaline Phosphatase: 81 IU/L (ref 44–121)
BUN/Creatinine Ratio: 11 (ref 9–23)
BUN: 8 mg/dL (ref 6–20)
Bilirubin Total: 0.2 mg/dL (ref 0.0–1.2)
CO2: 23 mmol/L (ref 20–29)
Calcium: 9.5 mg/dL (ref 8.7–10.2)
Chloride: 101 mmol/L (ref 96–106)
Creatinine, Ser: 0.75 mg/dL (ref 0.57–1.00)
Globulin, Total: 3.1 g/dL (ref 1.5–4.5)
Glucose: 88 mg/dL (ref 70–99)
Potassium: 4.3 mmol/L (ref 3.5–5.2)
Sodium: 140 mmol/L (ref 134–144)
Total Protein: 7.5 g/dL (ref 6.0–8.5)
eGFR: 109 mL/min/{1.73_m2} (ref >59)

## 2021-10-14 LAB — CBC WITH DIFFERENTIAL/PLATELET

## 2021-10-14 LAB — LIPID PANEL WITH LDL/HDL RATIO
Cholesterol, Total: 185 mg/dL (ref 100–199)
HDL: 46 mg/dL (ref >39)
LDL Chol Calc (NIH): 122 mg/dL — ABNORMAL HIGH (ref 0–99)
LDL/HDL Ratio: 2.7 ratio (ref 0.0–3.2)
Triglycerides: 94 mg/dL (ref 0–149)
VLDL Cholesterol Cal: 17 mg/dL (ref 5–40)

## 2021-10-14 LAB — HEMOGLOBIN A1C
Est. average glucose Bld gHb Est-mCnc: 114 mg/dL
Hgb A1c MFr Bld: 5.6 % (ref 4.8–5.6)

## 2021-10-14 LAB — INSULIN, RANDOM: INSULIN: 17.6 u[IU]/mL (ref 2.6–24.9)

## 2021-10-14 LAB — T3: T3, Total: 122 ng/dL (ref 71–180)

## 2021-10-14 LAB — T4, FREE: Free T4: 1.12 ng/dL (ref 0.82–1.77)

## 2021-10-14 LAB — FOLATE: Folate: 7.8 ng/mL (ref >3.0)

## 2021-10-14 LAB — VITAMIN B12: Vitamin B-12: 894 pg/mL (ref 232–1245)

## 2021-10-14 LAB — VITAMIN D 25 HYDROXY (VIT D DEFICIENCY, FRACTURES): Vit D, 25-Hydroxy: 26.8 ng/mL — ABNORMAL LOW (ref 30.0–100.0)

## 2021-10-14 LAB — TSH: TSH: 1.4 u[IU]/mL (ref 0.450–4.500)

## 2021-10-24 NOTE — Progress Notes (Signed)
Chief Complaint:   Angelica Robles (MR# [id number]) is a 31 y.o. female who presents for evaluation and treatment of obesity and related comorbidities. Current BMI is Body mass index is 46.27 kg/m. [name] has been struggling with her weight for many years and has been unsuccessful in either losing weight, maintaining weight loss, or reaching her healthy weight goal.  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 Works 7-4:30/5:30 pm. Lives with mom and sister who will be eating healthier with her. Does not care much for beef. Eats out 3-4 times a week. Skips lunch daily. Water in am, at 9 a cup of apple sauce and [place] bar. 11am--small bag of chips--Sour cream and onion/BBQ. Skips lunch. 4:30-5pm, will go out to get food--[employer] bites and salad and bread. [employer]illed chicken with honey mustard and bacon and salad ( feels full). 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  Angelica Robles is currently in the action stage of change and ready to dedicate time achieving and maintaining a healthier weight. Angelica Robles is interested in becoming our patient and working on intensive lifestyle modifications including (but not limited to) diet and exercise for weight loss.  Angelica Robles's habits were reviewed today and are as follows: she thinks her family will eat healthier with her, her desired weight loss is 45 lbs, she has been heavy most of her life, she started gaining weight this past year, her heaviest weight ever was 260 pounds, she is a picky eater and doesn't like to eat healthier foods, she has significant food cravings issues, she skips meals frequently, she is frequently drinking liquids with calories, and she frequently makes poor food choices.  Depression Screen Angelica Robles's Food and Mood (modified PHQ-9) score was 9.     10/13/2021    9:13 AM  Depression screen PHQ 2/9  Decreased Interest 1  Down, Depressed, Hopeless 1  PHQ - 2  Score 2  Altered sleeping 1  Tired, decreased energy 2  Change in appetite 2  Feeling bad or failure about yourself  1  Trouble concentrating 1  Moving slowly or fidgety/restless 0  Suicidal thoughts 0  PHQ-9 Score 9  Difficult doing work/chores Not difficult at all   Subjective:   1. Other fatigue EKG--NSR with poor R wave progression. Ellene admits to daytime somnolence and admits to waking up still tired. Patient has a history of symptoms of daytime fatigue and morning fatigue. [name] generally gets 7 hours of sleep per night, and states that she has generally restful sleep. Snoring is present. Apneic episodes are not present. Epworth Sleepiness Score is 8.    2. SOBOE (shortness of breath on exertion) Angelica Robles notes increasing shortness of breath with exercising and seems to be worsening over time with weight gain. She notes getting out of breath sooner with activity than she used to. This has not gotten worse recently. Angelica Robles denies shortness of breath at rest or orthopnea.   3. Vitamin D deficiency Angelica Robles is on 2000 Iu daily. Last Vit D level of 22.6. Denies any nausea, vomiting or muscle weakness. She notes fatigue.  4. PCOS (polycystic ovarian syndrome) Takita has very irregular periods. Diagnosed by OBGYN.  Assessment/Plan:   1. Other fatigue We will obtain labs today. [name] does feel that her weight is causing her energy to be lower than it should be. Fatigue may be related to obesity, depression or many other causes. Labs will be ordered, and  in the meanwhile, [name] will focus on self care including making healthy food choices, increasing physical activity and focusing on stress reduction.   - EKG 12-Lead - Vitamin B12 - Comprehensive metabolic panel - Folate - TSH - T4, free - T3  2. SOBOE (shortness of breath on exertion) [name] does feel that she gets out of breath more easily that she used to when she exercises. [name]'s shortness of breath  appears to be obesity related and exercise induced. She has agreed to work on weight loss and gradually increase exercise to treat her exercise induced shortness of breath. Will continue to monitor closely.   - CBC with Differential/Platelet - Lipid Panel With LDL/HDL Ratio  3. Vitamin D deficiency We will obtain labs today.  - VITAMIN D 25 Hydroxy (Vit-D Deficiency, Fractures)  4. PCOS (polycystic ovarian syndrome) We will obtain labs today.  - Hemoglobin A1c - Insulin, random  5. Depression screening [name] had a positive depression screening. Depression is commonly associated with obesity and often results in emotional eating behaviors. We will monitor this closely and work on CBT to help improve the non-hunger eating patterns. Referral to Psychology may be required if no improvement is seen as she continues in our clinic.   6. Class 3 severe obesity with serious comorbidity and body mass index (BMI) of 45.0 to 49.9 in adult, unspecified obesity type ([place]) Esteen is currently in the action stage of change and her goal is to continue with weight loss efforts. I recommend Angelica Robles begin the structured treatment plan as follows:  She has agreed to the Category 3 Plan. With 8oz at night. Additional breakfast options given.  Exercise goals: No exercise has been prescribed at this time.   Behavioral modification strategies: increasing lean protein intake, meal planning and cooking strategies, keeping healthy foods in the home, and planning for success.  She was informed of the importance of frequent follow-up visits to maximize her success with intensive lifestyle modifications for her multiple health conditions. She was informed we would discuss her lab results at her next visit unless there is a critical issue that needs to be addressed sooner. Angelica Robles agreed to keep her next visit at the agreed upon time to discuss these results.  Objective:   Blood pressure 114/78, pulse (!)  108, temperature (!) 97.5 F (36.4 C), height 5\' 2"  (1.575 m), weight 253 lb (114.8 kg), last menstrual period 08/01/2021, SpO2 98 %. Body mass index is 46.27 kg/m.  EKG: Normal sinus rhythm, rate 86 bpm.  Indirect Calorimeter completed today shows a VO2 of 248 ml and a REE of 1714.  Her calculated basal metabolic rate is 0000000 thus her basal metabolic rate is worse than expected.  General: Cooperative, alert, well developed, in no acute distress. HEENT: Conjunctivae and lids unremarkable. Cardiovascular: Regular rhythm.  Lungs: Normal work of breathing. Neurologic: No focal deficits.   Lab Results  Component Value Date   CREATININE 0.75 10/13/2021   BUN 8 10/13/2021   NA 140 10/13/2021   K 4.3 10/13/2021   CL 101 10/13/2021   CO2 23 10/13/2021   Lab Results  Component Value Date   ALT 17 10/13/2021   AST 13 10/13/2021   ALKPHOS 81 10/13/2021   BILITOT 0.2 10/13/2021   Lab Results  Component Value Date   HGBA1C 5.6 10/13/2021   HGBA1C 5.5 09/17/2017   HGBA1C 5.5 09/05/2016   Lab Results  Component Value Date   INSULIN 17.6 10/13/2021   Lab Results  Component  Value Date   TSH 1.400 10/13/2021   Lab Results  Component Value Date   CHOL 185 10/13/2021   HDL 46 10/13/2021   LDLCALC 122 (H) 10/13/2021   TRIG 94 10/13/2021   Lab Results  Component Value Date   WBC CANCELED 10/13/2021   HGB CANCELED 10/13/2021   HCT CANCELED 10/13/2021   MCV CANCELED 10/13/2021   PLT CANCELED 10/13/2021   Lab Results  Component Value Date   FERRITIN 567 (H) 08/10/2019   Attestation Statements:   Reviewed by clinician on day of visit: allergies, medications, problem list, medical history, surgical history, family history, social history, and previous encounter notes.  Time spent on visit including pre-visit chart review and post-visit charting and care was 42 minutes.   I, Elnora Morrison, RMA am acting as transcriptionist for Coralie Common, MD.  This is the patient's  first visit at [facility]. The patient's NEW PATIENT PACKET was reviewed at length. Included in the packet: current and past health history, medications, allergies, ROS, gynecologic history (women only), surgical history, family history, social history, weight history, weight loss surgery history (for those that have had weight loss surgery), nutritional evaluation, mood and food questionnaire, PHQ9, Epworth questionnaire, sleep habits questionnaire, patient life and health improvement goals questionnaire. These will all be scanned into the patient's chart under media.   During the visit, I independently reviewed the patient's EKG, bioimpedance scale results, and indirect calorimeter results. I used this information to tailor a meal plan for the patient that will help her to lose weight and will improve her obesity-related conditions going forward. I performed a medically necessary appropriate examination and/or evaluation. I discussed the assessment and treatment plan with the patient. The patient was provided an opportunity to ask questions and all were answered. The patient agreed with the plan and demonstrated an understanding of the instructions. Labs were ordered at this visit and will be reviewed at the next visit unless more critical results need to be addressed immediately. Clinical information was updated and documented in the EMR.   I have reviewed the above documentation for accuracy and completeness, and I agree with the above. - Coralie Common, MD

## 2021-11-03 ENCOUNTER — Ambulatory Visit (INDEPENDENT_AMBULATORY_CARE_PROVIDER_SITE_OTHER): Payer: Managed Care, Other (non HMO) | Admitting: Family Medicine

## 2021-11-03 ENCOUNTER — Encounter (INDEPENDENT_AMBULATORY_CARE_PROVIDER_SITE_OTHER): Payer: Self-pay | Admitting: Family Medicine

## 2021-11-03 VITALS — BP 120/81 | HR 68 | Temp 98.4°F | Ht 62.0 in | Wt 251.0 lb

## 2021-11-03 DIAGNOSIS — E559 Vitamin D deficiency, unspecified: Secondary | ICD-10-CM

## 2021-11-03 DIAGNOSIS — E88819 Insulin resistance, unspecified: Secondary | ICD-10-CM

## 2021-11-03 DIAGNOSIS — E669 Obesity, unspecified: Secondary | ICD-10-CM

## 2021-11-03 DIAGNOSIS — Z6841 Body Mass Index (BMI) 40.0 and over, adult: Secondary | ICD-10-CM

## 2021-11-03 DIAGNOSIS — E7849 Other hyperlipidemia: Secondary | ICD-10-CM

## 2021-11-03 MED ORDER — VITAMIN D (ERGOCALCIFEROL) 1.25 MG (50000 UNIT) PO CAPS: 50000.0000 [IU] | ORAL_CAPSULE | ORAL | 0 refills | Status: DC

## 2021-11-14 NOTE — Progress Notes (Signed)
Chief Complaint:   OBESITY Angelica Robles is here to discuss her progress with her obesity treatment plan along with follow-up of her obesity related diagnoses. Angelica Robles is on the Category 3 Plan and states she is following her eating plan approximately 90% of the time. Angelica Robles states she is exercising 0 minutes 0 times per week.  Today's visit was #: 2 Starting weight: 253 lbs Starting date: 10/13/2021 Today's weight: 251 lbs Today's date: 11/03/2021 Total lbs lost to date: 2 lbs Total lbs lost since last in-office visit: 2  Interim History: Angelica Robles notes, breakfast was good--she found some options she liked that surprised her (sugar free jelly, Special K cereal). She did eat full 300 calorie of snacks--once each week she would eat what she wanted for dinner. She was paying attention to protein intake. Not necessarily struggling with what to eat but how to move it around.  Subjective:   1. Other hyperlipidemia Labs discussed during visit today. Cianna's last LDL 122, HDL 46, Trigly 94. Not on any medications.  2. Vitamin D deficiency Labs discussed during visit today. Angelica Robles is on Vit D 2k IU a day. Vit D level of 26.8. Notes fatigue.  3. Insulin resistance Labs discussed during visit today. Angelica Robles's A1c was 5.6, Insulin 17.6.  Assessment/Plan:   1. Other hyperlipidemia Continue current meal plan without any changes.  2. Vitamin D deficiency I will increase Vit D to 50k IU weekly. Start Vit D 50k IU once a week for 1 month with 0 refills.  -Start Vitamin D, Ergocalciferol, (DRISDOL) 1.25 MG (50000 UNIT) CAPS capsule; Take 1 capsule (50,000 Units total) by mouth every 7 (seven) days.  Dispense: 4 capsule; Refill: 0  3. Insulin resistance Pathophysiology of IR, Prediabetes, Diabetes discussed today. Continue Cat 3 without any changes in medication at this time.  4. Obesity with current BMI of 46.1 Angelica Robles is currently in the action stage of change. As such, her goal  is to continue with weight loss efforts. She has agreed to the Category 3 Plan.   Exercise goals: No exercise has been prescribed at this time.  Behavioral modification strategies: increasing lean protein intake, meal planning and cooking strategies, keeping healthy foods in the home, and planning for success.  Angelica Robles has agreed to follow-up with our clinic in 3 weeks. She was informed of the importance of frequent follow-up visits to maximize her success with intensive lifestyle modifications for her multiple health conditions.   Objective:   Blood pressure 120/81, pulse 68, temperature 98.4 F (36.9 C), height 5\' 2"  (1.575 m), weight 251 lb (113.9 kg), SpO2 100 %. Body mass index is 45.91 kg/m.  General: Cooperative, alert, well developed, in no acute distress. HEENT: Conjunctivae and lids unremarkable. Cardiovascular: Regular rhythm.  Lungs: Normal work of breathing. Neurologic: No focal deficits.   Lab Results  Component Value Date   CREATININE 0.75 10/13/2021   BUN 8 10/13/2021   NA 140 10/13/2021   K 4.3 10/13/2021   CL 101 10/13/2021   CO2 23 10/13/2021   Lab Results  Component Value Date   ALT 17 10/13/2021   AST 13 10/13/2021   ALKPHOS 81 10/13/2021   BILITOT 0.2 10/13/2021   Lab Results  Component Value Date   HGBA1C 5.6 10/13/2021   HGBA1C 5.5 09/17/2017   HGBA1C 5.5 09/05/2016   Lab Results  Component Value Date   INSULIN 17.6 10/13/2021   Lab Results  Component Value Date   TSH 1.400 10/13/2021   Lab  Results  Component Value Date   CHOL 185 10/13/2021   HDL 46 10/13/2021   LDLCALC 122 (H) 10/13/2021   TRIG 94 10/13/2021   Lab Results  Component Value Date   VD25OH 26.8 (L) 10/13/2021   Lab Results  Component Value Date   WBC CANCELED 10/13/2021   HGB CANCELED 10/13/2021   HCT CANCELED 10/13/2021   MCV CANCELED 10/13/2021   PLT CANCELED 10/13/2021   Lab Results  Component Value Date   FERRITIN 567 (H) 08/10/2019   Attestation  Statements:   Reviewed by clinician on day of visit: allergies, medications, problem list, medical history, surgical history, family history, social history, and previous encounter notes.  Time spent on visit including pre-visit chart review and post-visit care and charting was 42 minutes.   I, Fortino Sic, RMA am acting as transcriptionist for Reuben Likes, MD.  I have reviewed the above documentation for accuracy and completeness, and I agree with the above. - Reuben Likes, MD

## 2021-11-28 ENCOUNTER — Ambulatory Visit (INDEPENDENT_AMBULATORY_CARE_PROVIDER_SITE_OTHER): Payer: Managed Care, Other (non HMO) | Admitting: Family Medicine

## 2021-12-01 ENCOUNTER — Ambulatory Visit (INDEPENDENT_AMBULATORY_CARE_PROVIDER_SITE_OTHER): Payer: Managed Care, Other (non HMO) | Admitting: Family Medicine

## 2021-12-01 ENCOUNTER — Encounter (INDEPENDENT_AMBULATORY_CARE_PROVIDER_SITE_OTHER): Payer: Self-pay | Admitting: Family Medicine

## 2021-12-01 VITALS — BP 127/68 | HR 69 | Temp 98.5°F | Ht 62.0 in | Wt 251.0 lb

## 2021-12-01 DIAGNOSIS — E7849 Other hyperlipidemia: Secondary | ICD-10-CM

## 2021-12-01 DIAGNOSIS — E559 Vitamin D deficiency, unspecified: Secondary | ICD-10-CM

## 2021-12-01 DIAGNOSIS — Z6841 Body Mass Index (BMI) 40.0 and over, adult: Secondary | ICD-10-CM | POA: Diagnosis not present

## 2021-12-01 DIAGNOSIS — E669 Obesity, unspecified: Secondary | ICD-10-CM

## 2021-12-01 MED ORDER — VITAMIN D (ERGOCALCIFEROL) 1.25 MG (50000 UNIT) PO CAPS: 50000.0000 [IU] | ORAL_CAPSULE | ORAL | 0 refills | Status: DC

## 2021-12-15 NOTE — Progress Notes (Signed)
Chief Complaint:   OBESITY Angelica Robles is here to discuss her progress with her obesity treatment plan along with follow-up of her obesity related diagnoses. Angelica Robles is on the Category 3 Plan and states she is following her eating plan approximately 90% of the time. Angelica Robles states she is exercising 0 minutes 0 times per week.  Today's visit was #: 3 Starting weight: 253 lbs Starting date: 10/13/2021 Today's weight: 251 lbs Today's date: 12/01/2021 Total lbs lost to date: 2 lbs Total lbs lost since last in-office visit: 0  Interim History: Angelica Robles had a low key Thanksgiving. Feels like her taste buds have changed. She feels like sometimes she has so much and then feels so full and stuffed. No plans for the month of December. Dinner has been difficult to adhere to.  Subjective:   1. Vitamin D deficiency Angelica Robles is currently taking prescription Vit D 50,000 IU once a week. She notes fatigue. Her last Vit D level of 26.  2. Other hyperlipidemia Angelica Robles's LDL of 122, HDL of 46, Trigly of 94. She is not on medication.  Assessment/Plan:   1. Vitamin D deficiency We will refill Vit D 50K IU once a week for 1 month with 0 refills.  -Refill Vitamin D, Ergocalciferol, (DRISDOL) 1.25 MG (50000 UNIT) CAPS capsule; Take 1 capsule (50,000 Units total) by mouth every 7 (seven) days.  Dispense: 4 capsule; Refill: 0  2. Other hyperlipidemia Continue with Cat 3 without changes in meal plan.  3. Obesity with current BMI of 45.9 Angelica Robles is currently in the action stage of change. As such, her goal is to continue with weight loss efforts. She has agreed to the Category 3 Plan.   Exercise goals: No exercise has been prescribed at this time.  Behavioral modification strategies: increasing lean protein intake, no skipping meals, meal planning and cooking strategies, keeping healthy foods in the home, and planning for success.  Angelica Robles has agreed to follow-up with our clinic in 3 weeks. She  was informed of the importance of frequent follow-up visits to maximize her success with intensive lifestyle modifications for her multiple health conditions.   Objective:   Blood pressure 127/68, pulse 69, temperature 98.5 F (36.9 C), height 5\' 2"  (1.575 m), weight 251 lb (113.9 kg), SpO2 95 %. Body mass index is 45.91 kg/m.  General: Cooperative, alert, well developed, in no acute distress. HEENT: Conjunctivae and lids unremarkable. Cardiovascular: Regular rhythm.  Lungs: Normal work of breathing. Neurologic: No focal deficits.   Lab Results  Component Value Date   CREATININE 0.75 10/13/2021   BUN 8 10/13/2021   NA 140 10/13/2021   K 4.3 10/13/2021   CL 101 10/13/2021   CO2 23 10/13/2021   Lab Results  Component Value Date   ALT 17 10/13/2021   AST 13 10/13/2021   ALKPHOS 81 10/13/2021   BILITOT 0.2 10/13/2021   Lab Results  Component Value Date   HGBA1C 5.6 10/13/2021   HGBA1C 5.5 09/17/2017   HGBA1C 5.5 09/05/2016   Lab Results  Component Value Date   INSULIN 17.6 10/13/2021   Lab Results  Component Value Date   TSH 1.400 10/13/2021   Lab Results  Component Value Date   CHOL 185 10/13/2021   HDL 46 10/13/2021   LDLCALC 122 (H) 10/13/2021   TRIG 94 10/13/2021   Lab Results  Component Value Date   VD25OH 26.8 (L) 10/13/2021   Lab Results  Component Value Date   WBC CANCELED 10/13/2021  HGB CANCELED 10/13/2021   HCT CANCELED 10/13/2021   MCV CANCELED 10/13/2021   PLT CANCELED 10/13/2021   Lab Results  Component Value Date   FERRITIN 567 (H) 08/10/2019   Attestation Statements:   Reviewed by clinician on day of visit: allergies, medications, problem list, medical history, surgical history, family history, social history, and previous encounter notes.  I, Fortino Sic, RMA am acting as transcriptionist for Reuben Likes, MD.  I have reviewed the above documentation for accuracy and completeness, and I agree with the above. - Reuben Likes, MD

## 2021-12-27 ENCOUNTER — Encounter (INDEPENDENT_AMBULATORY_CARE_PROVIDER_SITE_OTHER): Payer: Self-pay | Admitting: Physician Assistant

## 2021-12-27 ENCOUNTER — Ambulatory Visit (INDEPENDENT_AMBULATORY_CARE_PROVIDER_SITE_OTHER): Payer: Managed Care, Other (non HMO) | Admitting: Physician Assistant

## 2021-12-27 VITALS — BP 134/80 | HR 88 | Temp 98.5°F | Ht 62.0 in | Wt 251.0 lb

## 2021-12-27 DIAGNOSIS — E559 Vitamin D deficiency, unspecified: Secondary | ICD-10-CM | POA: Diagnosis not present

## 2021-12-27 DIAGNOSIS — Z6841 Body Mass Index (BMI) 40.0 and over, adult: Secondary | ICD-10-CM | POA: Diagnosis not present

## 2021-12-27 DIAGNOSIS — E88819 Insulin resistance, unspecified: Secondary | ICD-10-CM | POA: Diagnosis not present

## 2021-12-27 DIAGNOSIS — E669 Obesity, unspecified: Secondary | ICD-10-CM

## 2021-12-27 MED ORDER — LOMAIRA 8 MG PO TABS: 8.0000 mg | ORAL_TABLET | Freq: Every day | ORAL | 0 refills | Status: DC

## 2021-12-27 MED ORDER — TOPIRAMATE 25 MG PO TABS: 25.0000 mg | ORAL_TABLET | Freq: Every day | ORAL | 0 refills | Status: DC

## 2021-12-27 MED ORDER — VITAMIN D (ERGOCALCIFEROL) 1.25 MG (50000 UNIT) PO CAPS: 50000.0000 [IU] | ORAL_CAPSULE | ORAL | 0 refills | Status: DC

## 2022-01-02 ENCOUNTER — Other Ambulatory Visit (INDEPENDENT_AMBULATORY_CARE_PROVIDER_SITE_OTHER): Payer: Self-pay | Admitting: Physician Assistant

## 2022-01-02 DIAGNOSIS — E559 Vitamin D deficiency, unspecified: Secondary | ICD-10-CM

## 2022-01-03 ENCOUNTER — Telehealth (INDEPENDENT_AMBULATORY_CARE_PROVIDER_SITE_OTHER): Payer: Self-pay | Admitting: Physician Assistant

## 2022-01-03 ENCOUNTER — Encounter (INDEPENDENT_AMBULATORY_CARE_PROVIDER_SITE_OTHER): Payer: Self-pay

## 2022-01-03 NOTE — Telephone Encounter (Signed)
Patient called because she was seen on December 26 and her prescription was not called in [place]. Please call and inform the patient

## 2022-01-05 ENCOUNTER — Encounter (INDEPENDENT_AMBULATORY_CARE_PROVIDER_SITE_OTHER): Payer: Self-pay

## 2022-01-09 ENCOUNTER — Telehealth (INDEPENDENT_AMBULATORY_CARE_PROVIDER_SITE_OTHER): Payer: Self-pay

## 2022-01-09 NOTE — Telephone Encounter (Signed)
Submitted a PA for [place] today at 5:09, waiting for a decision  Approved  until 07/10/2022

## 2022-01-11 ENCOUNTER — Encounter (INDEPENDENT_AMBULATORY_CARE_PROVIDER_SITE_OTHER): Payer: Self-pay

## 2022-01-12 NOTE — Progress Notes (Signed)
Chief Complaint:   OBESITY Angelica Robles is here to discuss her progress with her obesity treatment plan along with follow-up of her obesity related diagnoses. Angelica Robles is on the Category 3 Plan and states she is following her eating plan approximately 70% of the time. Angelica Robles states she is exercising 0 minutes 0 times per week.  Today's visit was #: 4 Starting weight: 253 lbs Starting date: 10/13/2021 Today's weight: 251 lbs Today's date: 12/27/2021 Total lbs lost to date: 2 lbs Total lbs lost since last in-office visit: 0  Interim History: Angelica Robles reports she has been struggling to eat all that is on her prescribed nutrition plan especially when she is not on her usual schedule, like over the holidays.  She reports she is trying to get back on track with her eating plan as her schedule normalizes. She is interested in medications for weight loss and we discussed starting phentermine/topiramate.  She reports that she is not currently sexually active and we discussed the importance of utilizing birth control to avoid pregnancy while on medications.  Subjective:   1. Vitamin D deficiency Angelica Robles is currently is taking ergocalciferol once weekly-no side effects.  2. Insulin resistance Trinidee' s  A1c was 5.6/insulin 17.6.  Has taken Metformin in the past.  She is interested in GLP-1, but check with insurance and no coverage.  Working on decreasing simple carbs and increasing lean protein.  Assessment/Plan:   1. Vitamin D deficiency Continue/Refill Vit D 50K IU once a week for 1 month with 0 refills.   -Refill Vitamin D, Ergocalciferol, (DRISDOL) 1.25 MG (50000 UNIT) CAPS capsule; Take 1 capsule (50,000 Units total) by mouth every 7 (seven) days.  Dispense: 4 capsule; Refill: 0  2. Insulin resistance Continue Prescribed Nutrition Plan to decrease simple carbohydrates, increase lean protein and exercise to promote weight loss.  3. Obesity with current BMI of 45.9 Start Lomaira  4mg  times 2 weeks and then 8mg  daily AND Start Topiramate 25 mg daily for 1 month with 0 refills.  -Start Phentermine HCl (LOMAIRA) 8 MG TABS; Take 8 mg by mouth daily. Take 1/2 tablet daily x 2 weeks and if tolerated well, increase to 8 mg daily.  Dispense: 28 tablet; Refill: 0  -Start topiramate (TOPAMAX) 25 MG tablet; Take 1 tablet (25 mg total) by mouth daily.  Dispense: 30 tablet; Refill: 0 We discussed the importance of birth control and consistent use due to the risk of pregnancy on these medications.  Angelica Robles is currently in the action stage of change. As such, her goal is to continue with weight loss efforts. She has agreed to the Category 3 Plan.   Exercise goals: All adults should avoid inactivity. Some physical activity is better than none, and adults who participate in any amount of physical activity gain some health benefits.  Behavioral modification strategies: increasing lean protein intake, decreasing simple carbohydrates, better snacking choices, and planning for success.  Angelica Robles has agreed to follow-up with our clinic in 3 weeks. She was informed of the importance of frequent follow-up visits to maximize her success with intensive lifestyle modifications for her multiple health conditions.   Objective:   Blood pressure 134/80, pulse 88, temperature 98.5 F (36.9 C), height 5\' 2"  (1.575 m), weight 251 lb (113.9 kg), SpO2 98 %. Body mass index is 45.91 kg/m.  General: Cooperative, alert, well developed, in no acute distress. HEENT: Conjunctivae and lids unremarkable. Cardiovascular: Regular rhythm.  Lungs: Normal work of breathing. Neurologic: No focal deficits.  Lab Results  Component Value Date   CREATININE 0.75 10/13/2021   BUN 8 10/13/2021   NA 140 10/13/2021   K 4.3 10/13/2021   CL 101 10/13/2021   CO2 23 10/13/2021   Lab Results  Component Value Date   ALT 17 10/13/2021   AST 13 10/13/2021   ALKPHOS 81 10/13/2021   BILITOT 0.2 10/13/2021   Lab  Results  Component Value Date   HGBA1C 5.6 10/13/2021   HGBA1C 5.5 09/17/2017   HGBA1C 5.5 09/05/2016   Lab Results  Component Value Date   INSULIN 17.6 10/13/2021   Lab Results  Component Value Date   TSH 1.400 10/13/2021   Lab Results  Component Value Date   CHOL 185 10/13/2021   HDL 46 10/13/2021   LDLCALC 122 (H) 10/13/2021   TRIG 94 10/13/2021   Lab Results  Component Value Date   VD25OH 26.8 (L) 10/13/2021   Lab Results  Component Value Date   WBC CANCELED 10/13/2021   HGB CANCELED 10/13/2021   HCT CANCELED 10/13/2021   MCV CANCELED 10/13/2021   PLT CANCELED 10/13/2021   Lab Results  Component Value Date   FERRITIN 567 (H) 08/10/2019   Attestation Statements:   Reviewed by clinician on day of visit: allergies, medications, problem list, medical history, surgical history, family history, social history, and previous encounter notes.  I, Brendell Tyus, am acting as transcriptionist for [employer], PA.  I have reviewed the above documentation for accuracy and completeness, and I agree with the above. -  Nikiyah Fackler,PA-C

## 2022-01-19 ENCOUNTER — Ambulatory Visit (INDEPENDENT_AMBULATORY_CARE_PROVIDER_SITE_OTHER): Payer: Managed Care, Other (non HMO) | Admitting: Physician Assistant

## 2022-01-21 ENCOUNTER — Other Ambulatory Visit (INDEPENDENT_AMBULATORY_CARE_PROVIDER_SITE_OTHER): Payer: Self-pay | Admitting: Physician Assistant

## 2022-01-27 ENCOUNTER — Other Ambulatory Visit (INDEPENDENT_AMBULATORY_CARE_PROVIDER_SITE_OTHER): Payer: Self-pay | Admitting: Physician Assistant

## 2022-01-27 DIAGNOSIS — E559 Vitamin D deficiency, unspecified: Secondary | ICD-10-CM

## 2022-01-30 ENCOUNTER — Ambulatory Visit (INDEPENDENT_AMBULATORY_CARE_PROVIDER_SITE_OTHER): Payer: Managed Care, Other (non HMO) | Admitting: Physician Assistant

## 2022-01-30 ENCOUNTER — Encounter (INDEPENDENT_AMBULATORY_CARE_PROVIDER_SITE_OTHER): Payer: Self-pay | Admitting: Physician Assistant

## 2022-01-30 VITALS — BP 125/81 | HR 84 | Temp 98.1°F | Ht 62.0 in | Wt 251.0 lb

## 2022-01-30 DIAGNOSIS — E88819 Insulin resistance, unspecified: Secondary | ICD-10-CM

## 2022-01-30 DIAGNOSIS — E559 Vitamin D deficiency, unspecified: Secondary | ICD-10-CM

## 2022-01-30 DIAGNOSIS — Z6841 Body Mass Index (BMI) 40.0 and over, adult: Secondary | ICD-10-CM | POA: Diagnosis not present

## 2022-01-30 DIAGNOSIS — E669 Obesity, unspecified: Secondary | ICD-10-CM

## 2022-01-30 MED ORDER — TOPIRAMATE 50 MG PO TABS: 50.0000 mg | ORAL_TABLET | Freq: Every day | ORAL | 0 refills | Status: DC

## 2022-01-30 MED ORDER — VITAMIN D (ERGOCALCIFEROL) 1.25 MG (50000 UNIT) PO CAPS: 50000.0000 [IU] | ORAL_CAPSULE | ORAL | 0 refills | Status: DC

## 2022-01-30 MED ORDER — LOMAIRA 8 MG PO TABS: 8.0000 mg | ORAL_TABLET | Freq: Every day | ORAL | 0 refills | Status: DC

## 2022-02-07 NOTE — Progress Notes (Signed)
Chief Complaint:   OBESITY Angelica Robles is here to discuss her progress with her obesity treatment plan along with follow-up of her obesity related diagnoses. Angelica Robles is on the Category 3 Plan and states she is following her eating plan approximately 75% of the time. Angelica Robles states she is exercising 0 minutes 0 times per week.  Today's visit was #: 5 Starting weight: 253 lbs Starting date: 10/13/2021 Today's weight: 251 lbs Today's date: 01/30/2022 Total lbs lost to date: 2 lbs Total lbs lost since last in-office visit: 0  Interim History: Angelica Robles has done well.  She started phentermine/topiramate about 16 days ago. She reports her appetite and hunger are significantly down with the phentermine(Lomaira) and topiramate.  She has been able to focus on meeting her protein goals and meeting calorie goals, and reports she has not been skipping meals. She reports she has more energy and feels less fatigued.  She  Subjective:   1. Vitamin D deficiency Angelica Robles is on ergocalciferol once weekly without side effects.  On 01/04/22 Vit D level increased to 37.3 at [facility] clinic, improved from 26.8 on 10/13/21.  2. Insulin resistance A1c 5.6, insulin 17.6 on 10/13/21.  Not on medication.  Working on decreasing simple carbs, increasing protein and exercise to promote weight loss, decrease insulin resistance, improve glycemic control and prevent progression to type 2 diabetes.  Assessment/Plan:   1. Vitamin D deficiency We will refill ergocalciferol once a week for 1 month with 0 refills.  -Refill Vitamin D, Ergocalciferol, (DRISDOL) 1.25 MG (50000 UNIT) CAPS capsule; Take 1 capsule (50,000 Units total) by mouth every 7 (seven) days.  Dispense: 4 capsule; Refill: 0  2. Insulin resistance Continue Prescribed Nutrition Plan and exercise to promote weight loss and to improve glycemic control/decrease insulin resistance and prevent progression to type 2 diabetes.  3. Obesity with current BMI of  45.9 Will refill Topiramate 50 mg daily and refill Phentermine 8 mg daily for 1 month with 0 refills.  -Refill topiramate (TOPAMAX) 50 MG tablet; Take 1 tablet (50 mg total) by mouth daily.  Dispense: 30 tablet; Refill: 0  -Refill Phentermine HCl (LOMAIRA) 8 MG TABS; Take 1 tablet (8 mg total) by mouth daily.  Dispense: 28 tablet; Refill: 0  Angelica Robles is currently in the action stage of change. As such, her goal is to continue with weight loss efforts. She has agreed to the Category 3 Plan.   Exercise goals: All adults should avoid inactivity. Some physical activity is better than none, and adults who participate in any amount of physical activity gain some health benefits.  Behavioral modification strategies: increasing lean protein intake, decreasing simple carbohydrates, no skipping meals, and planning for success.  Angelica Robles has agreed to follow-up with our clinic in 4 weeks. She was informed of the importance of frequent follow-up visits to maximize her success with intensive lifestyle modifications for her multiple health conditions.   Objective:   Blood pressure 125/81, pulse 84, temperature 98.1 F (36.7 C), height 5' 2"$  (1.575 m), weight 251 lb (113.9 kg), SpO2 98 %. Body mass index is 45.91 kg/m.  General: Cooperative, alert, well developed, in no acute distress. HEENT: Conjunctivae and lids unremarkable. Cardiovascular: Regular rhythm.  Lungs: Normal work of breathing. Neurologic: No focal deficits.   Lab Results  Component Value Date   CREATININE 0.75 10/13/2021   BUN 8 10/13/2021   NA 140 10/13/2021   K 4.3 10/13/2021   CL 101 10/13/2021   CO2 23 10/13/2021   Lab  Results  Component Value Date   ALT 17 10/13/2021   AST 13 10/13/2021   ALKPHOS 81 10/13/2021   BILITOT 0.2 10/13/2021   Lab Results  Component Value Date   HGBA1C 5.6 10/13/2021   HGBA1C 5.5 09/17/2017   HGBA1C 5.5 09/05/2016   Lab Results  Component Value Date   INSULIN 17.6 10/13/2021   Lab  Results  Component Value Date   TSH 1.400 10/13/2021   Lab Results  Component Value Date   CHOL 185 10/13/2021   HDL 46 10/13/2021   LDLCALC 122 (H) 10/13/2021   TRIG 94 10/13/2021   Lab Results  Component Value Date   VD25OH 26.8 (L) 10/13/2021   Lab Results  Component Value Date   WBC CANCELED 10/13/2021   HGB CANCELED 10/13/2021   HCT CANCELED 10/13/2021   MCV CANCELED 10/13/2021   PLT CANCELED 10/13/2021   Lab Results  Component Value Date   FERRITIN 567 (H) 08/10/2019   Attestation Statements:   Reviewed by clinician on day of visit: allergies, medications, problem list, medical history, surgical history, family history, social history, and previous encounter notes.  I, Brendell Tyus, am acting as transcriptionist for [employer], PA.  I have reviewed the above documentation for accuracy and completeness, and I agree with the above. -  Rawleigh Rode,PA-C

## 2022-02-27 ENCOUNTER — Ambulatory Visit (INDEPENDENT_AMBULATORY_CARE_PROVIDER_SITE_OTHER): Payer: Managed Care, Other (non HMO) | Admitting: Physician Assistant

## 2022-03-02 ENCOUNTER — Other Ambulatory Visit (INDEPENDENT_AMBULATORY_CARE_PROVIDER_SITE_OTHER): Payer: Self-pay | Admitting: Physician Assistant

## 2022-03-09 ENCOUNTER — Ambulatory Visit (INDEPENDENT_AMBULATORY_CARE_PROVIDER_SITE_OTHER): Payer: Managed Care, Other (non HMO) | Admitting: Physician Assistant

## 2022-03-09 ENCOUNTER — Encounter (INDEPENDENT_AMBULATORY_CARE_PROVIDER_SITE_OTHER): Payer: Self-pay | Admitting: Physician Assistant

## 2022-03-09 VITALS — BP 125/85 | HR 90 | Temp 98.5°F | Ht 62.0 in | Wt 255.0 lb

## 2022-03-09 DIAGNOSIS — Z6841 Body Mass Index (BMI) 40.0 and over, adult: Secondary | ICD-10-CM

## 2022-03-09 DIAGNOSIS — E669 Obesity, unspecified: Secondary | ICD-10-CM | POA: Diagnosis not present

## 2022-03-09 DIAGNOSIS — E88819 Insulin resistance, unspecified: Secondary | ICD-10-CM

## 2022-03-09 DIAGNOSIS — E559 Vitamin D deficiency, unspecified: Secondary | ICD-10-CM

## 2022-03-09 NOTE — Progress Notes (Signed)
Office: [phone]  /  Fax: [phone]  WEIGHT SUMMARY AND BIOMETRICS  Vitals Temp: 98.5 F (36.9 C) BP: 125/85 Pulse Rate: 90 SpO2: 98 %   Anthropometric Measurements Height: '5\' 2"'$  (1.575 m) Weight: 255 lb (115.7 kg) BMI (Calculated): 46.63 Weight at Last Visit: 251 lb Starting Weight: 253 lb Total Weight Loss (lbs): 2 lb (0.907 kg)   Body Composition  Body Fat %: 51.2 % Fat Mass (lbs): 130.6 lbs Muscle Mass (lbs): 118.4 lbs Total Body Water (lbs): 93.4 lbs Visceral Fat Rating : 15   Other Clinical Data Fasting: no Labs: no Today's Visit #: 6 Starting Date: 10/13/21     HPI  Chief Complaint: OBESITY  Fidencia is here to discuss her progress with her obesity treatment plan. She is on the the Category 3 Plan and states she is following her eating plan approximately 40 % of the time. She states she is exercising 60 minutes 4 times per week.   Interval History:  Since last office visit she stopped Lomaira/topiramate. She was having prolonged menses which stopped once she stopped the medication. She does not want to resume.  She is working on getting back on track with her nutrition plan. She is having difficulty not being consistently hungry and is not meeting calorie or protein goals. We discussed supplementing with protein shakes and provided recipes for making shakes at home.  She has been exercising more, going to the gym for 1 hr 4 times per week.    Pharmacotherapy: Stopped Lomaira/topiramate  PHYSICAL EXAM:  Blood pressure 125/85, pulse 90, temperature 98.5 F (36.9 C), height '5\' 2"'$  (1.575 m), weight 255 lb (115.7 kg), SpO2 98 %. Body mass index is 46.64 kg/m.  General: She is overweight, cooperative, alert, well developed, and in no acute distress. PSYCH: Has normal mood, affect and thought process.   Lungs: Normal breathing effort, no conversational dyspnea.  DIAGNOSTIC DATA REVIEWED:  BMET    Component Value Date/Time   NA 140  10/13/2021 1008   K 4.3 10/13/2021 1008   CL 101 10/13/2021 1008   CO2 23 10/13/2021 1008   GLUCOSE 88 10/13/2021 1008   GLUCOSE 118 (H) 08/06/2019 1215   BUN 8 10/13/2021 1008   CREATININE 0.75 10/13/2021 1008   CALCIUM 9.5 10/13/2021 1008   GFRNONAA >60 08/06/2019 1215   GFRAA >60 08/06/2019 1215   Lab Results  Component Value Date   HGBA1C 5.6 10/13/2021   HGBA1C 5.5 09/05/2016   Lab Results  Component Value Date   INSULIN 17.6 10/13/2021   Lab Results  Component Value Date   TSH 1.400 10/13/2021   CBC    Component Value Date/Time   WBC CANCELED 10/13/2021 1008   WBC 5.7 08/10/2019 0527   RBC CANCELED 10/13/2021 1008   RBC 4.70 08/10/2019 0527   HGB CANCELED 10/13/2021 1008   HCT CANCELED 10/13/2021 1008   PLT CANCELED 10/13/2021 1008   MCV CANCELED 10/13/2021 1008   MCH CANCELED 10/13/2021 1008   MCH 28.7 08/10/2019 0527   MCHC CANCELED 10/13/2021 1008   MCHC 32.3 08/10/2019 0527   RDW CANCELED 10/13/2021 1008   Iron Studies    Component Value Date/Time   FERRITIN 567 (H) 08/10/2019 0527   Lipid Panel     Component Value Date/Time   CHOL 185 10/13/2021 1008   TRIG 94 10/13/2021 1008   HDL 46 10/13/2021 1008   LDLCALC 122 (H) 10/13/2021 1008   Hepatic Function Panel     Component Value  Date/Time   PROT 7.5 10/13/2021 1008   ALBUMIN 4.4 10/13/2021 1008   AST 13 10/13/2021 1008   ALT 17 10/13/2021 1008   ALKPHOS 81 10/13/2021 1008   BILITOT 0.2 10/13/2021 1008      Component Value Date/Time   TSH 1.400 10/13/2021 1008   Nutritional Lab Results  Component Value Date   VD25OH 26.8 (L) 10/13/2021     ASSESSMENT AND PLAN  1. Insulin resistance Insulin Resistance Audianna has had elevated fasting insulin readings. Goal is HgbA1c < 5.7, fasting insulin closer to 5.   She denies polyphagia. Medication(s): None  Lab Results  Component Value Date   HGBA1C 5.6 10/13/2021   HGBA1C 5.5 09/17/2017   HGBA1C 5.5 09/05/2016   Lab Results   Component Value Date   INSULIN 17.6 10/13/2021    Plan She is working on nutrition plan to decrease simple carbohydrates, increase lean proteins and exercise to promote weight loss, improve glycemic control and prevent progression to Type 2 diabetes.    2. Vitamin D deficiency Vitamin D Deficiency Vitamin D is not at goal of 50.  Most recent vitamin D level was 26.8. She is on  prescription ergocalciferol 50,000 IU weekly. Lab Results  Component Value Date   VD25OH 26.8 (L) 10/13/2021    Plan: Continue prescription vitamin D 50,000 IU weekly.   3. Obesity ([place])- Start BMI 46.27     TREATMENT PLAN FOR OBESITY:  Recommended Dietary Goals  Crystina is currently in the action stage of change. As such, her goal is to continue weight management plan. She has agreed to the Category 3 Plan.  Behavioral Intervention  We discussed the following Behavioral Modification Strategies today: increasing lean protein intake, decreasing simple carbohydrates , increasing vegetables, avoiding skipping meals, increasing water intake, and work on meal planning and easy cooking plans.  Additional resources provided today: NA  Recommended Physical Activity Goals  Ellyanna has been advised to work up to 150 minutes of moderate intensity aerobic activity a week and strengthening exercises 2-3 times per week for cardiovascular health, weight loss maintenance and preservation of muscle mass.   She has agreed to increase physical activity in their day and reduce sedentary time (increase NEAT).  and continue physical activity as is.    Pharmacotherapy We discussed various medication options to help Jem with her weight loss efforts and we both agreed to stop Lomaira/topiramate and continue working on nutrition plan to decrease simple carbohydrates, increase lean proteins and exercise to promote weight loss, improve glycemic control and prevent progression to Type 2 diabetes.  .  Protein  Shakes  What is a protein shake? Protein shakes are usually made with a protein source (protein powder, yogurt, etc.) frozen fruit, milk, water and/or ice.  A protein shake can be a healthy way to get your protein if you miss a meal.    When to drink a protein shake? Protein shakes can be beneficial if you decide to not eat a meal or are unable to eat for whatever reason.  What are the benefits of a protein shake? Protein shakes are an excellent way to get an adequate protein for anyone but especially for persons who are vegetarians or vegan.  Additionally, protein shakes are great to drink after a workout for muscle recovery.  What are the best protein powders?         Whey protein Grow Whey Protein Isolate [employer] Whey Protein X Werks Grow Whey Protein Isolate  Plant Based Protein Swolverine  Plant Protein Gainful Vegan Protein Powder Transparent Labs Rice and Pea Powder  However just basic protein powder, either whey or plant based, is fine and it's usually less expensive.  Many protein powders can be bought locally at [employer] or another grocery store.   Prepared Protein Shakes Atkins Milk Chocolate Delight  Cal 160  Total fat 9gm  Total carb 7gm  Protein 15gm Ensure Original Vanilla  Cal 220  Total fat 6gm  Total carb 32gm  Protein 9gm [employer]land Signature Complete Nutrition Shake Cal 200 Total fat 6gm Total carb 22gm Protein 15g Orgain Sweet Vanilla Bean Cal 250  Total fat 7gm  Total carb 32gm  Protein 16gm Premier Protein Chocolate  Cal 160  Total fat 3gm  Total carb 5gm  Protein 30gm Premier Protein Vanilla  Cal 160  Total fat 3gm  Total carb 5gm  Protein 30gm Quest Protein Shake Chocolate  Cal 160  Total fat 3.5gm  Total carb 4gm  Protein 30gm  Quest Protein Shake Vanilla  Cal 160  Total fat 3gm  Total carb 3gm  Protein 30gm  Lactose free options Muscle Milk Genuine Chocolate  Cal 160  Total fat 5gm  Total carb 9gm  Protein 25gm Muscle Milk Genuine Vanilla  Cal 160   Total fat 5gm  Total carb 9gm  Protein 25gm Fairlife Chocolate  Cal 150-250  Total fat 2.5-8gm  Total carb 4-22gm  Protein 23-30gm    Fairlife Vanilla  Cal 150  Total fat 2.5gm  Total carb 3gm  Protein 30gm    Core Power  Chocolate or Vanilla   Cal 170  Total fat 4.5gm  Total carb 8gm  Protein 26/41gm   Ideas for Protein Shakes   High protein vegetables:  Spinach or Kale- 1/4-1/2 cup Fruit:  Fresh or frozen fruit- 1/2 cup  Protein Sources:   Greek yogurt Cottage cheese Fairlife milk Kefir  Nuts: Almonds- 2 Tbsp Peanuts- 2 Tbsp Cashews- 2 Tbsp Nut butter- 1 Tbsp  Seeds: Chia seeds- 1 Tbsp Flax seeds- 1 Tbsp Hemp seeds- 1 Tbsp   Basic Protein Shake Recipe  1 cup of frozen fruit 1/2 - 1 cup (Fairlife milk, yogurt or unsweetened almond milk) 1 scoop of protein powder (20-30 grams of protein per scoop) Optional:  artificial sweeteners (to taste), ice, water, and other ingredients listed on this sheet.  Breakfast Protein Shake  1 and 1/2 cups Fairlife Skim Milk 1 and 1/2 cups Ice 1 Greek yogurt cup 1/2 cup frozen berries 4-5 packets Splenda (to your taste)  This shake will fit in a 40 oz cup and is around 300 calories with 32 grams of protein.    Enjoy!     No follow-ups on file.Marland Kitchen She was informed of the importance of frequent follow up visits to maximize her success with intensive lifestyle modifications for her multiple health conditions.   ATTESTASTION STATEMENTS:  Reviewed by clinician on day of visit: allergies, medications, problem list, medical history, surgical history, family history, social history, and previous encounter notes.   I have personally spent 30 minutes total time today in preparation, patient care, nutritional counseling and documentation for this visit, including the following: review of clinical lab tests; review of medical tests/procedures/services.      Rally Ouch, PA-C

## 2022-04-06 ENCOUNTER — Encounter (INDEPENDENT_AMBULATORY_CARE_PROVIDER_SITE_OTHER): Payer: Self-pay | Admitting: Physician Assistant

## 2022-04-06 ENCOUNTER — Ambulatory Visit (INDEPENDENT_AMBULATORY_CARE_PROVIDER_SITE_OTHER): Payer: Managed Care, Other (non HMO) | Admitting: Physician Assistant

## 2022-04-06 VITALS — BP 121/79 | HR 80 | Temp 98.5°F | Ht 62.0 in | Wt 254.0 lb

## 2022-04-06 DIAGNOSIS — E88819 Insulin resistance, unspecified: Secondary | ICD-10-CM | POA: Diagnosis not present

## 2022-04-06 DIAGNOSIS — E669 Obesity, unspecified: Secondary | ICD-10-CM | POA: Diagnosis not present

## 2022-04-06 DIAGNOSIS — E559 Vitamin D deficiency, unspecified: Secondary | ICD-10-CM | POA: Diagnosis not present

## 2022-04-06 DIAGNOSIS — Z6841 Body Mass Index (BMI) 40.0 and over, adult: Secondary | ICD-10-CM | POA: Insufficient documentation

## 2022-04-06 MED ORDER — VITAMIN D (ERGOCALCIFEROL) 1.25 MG (50000 UNIT) PO CAPS: 50000.0000 [IU] | ORAL_CAPSULE | ORAL | 0 refills | Status: DC

## 2022-04-06 NOTE — Progress Notes (Signed)
Office: [phone]  /  Fax: [phone]  WEIGHT SUMMARY AND BIOMETRICS  Vitals Temp: 98.5 F (36.9 C) BP: 121/79 Pulse Rate: 80 SpO2: 100 %   Anthropometric Measurements Height: 5\' 2"  (1.575 m) Weight: 254 lb (115.2 kg) BMI (Calculated): 46.45 Weight at Last Visit: 255 lb Weight Lost Since Last Visit: 1 lb Weight Gained Since Last Visit: 0 lb Starting Weight: 253 lb Total Weight Loss (lbs): 3 lb (1.361 kg)   Body Composition  Body Fat %: 51.8 % Fat Mass (lbs): 131.8 lbs Muscle Mass (lbs): 116.4 lbs Total Body Water (lbs): 94 lbs Visceral Fat Rating : 15   Other Clinical Data Fasting: no Labs: no Today's Visit #: 7 Starting Date: 10/13/21     HPI  Chief Complaint: OBESITY  Angelica Robles is here to discuss her progress with her obesity treatment plan. She is on the the Category 3 Plan and states she is following her eating plan approximately 70-80 % of the time. She states she is exercising/gym 60 minutes 7 times per week.   Interval History:  Since last office visit she is down 1 lb. 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  Exercising daily- goes to gym, does weight training in addition to cardio   Pharmacotherapy: Stopped Phentermine/topiramate due to menorrhagia which stopped when she stopped the medications.   PHYSICAL EXAM:  Blood pressure 121/79, pulse 80, temperature 98.5 F (36.9 C), height 5\' 2"  (1.575 m), weight 254 lb (115.2 kg), SpO2 100 %. Body mass index is 46.46 kg/m.  General: She is overweight, cooperative, alert, well developed, and in no acute distress. PSYCH: Has normal mood, affect and thought process.   Cardiovascular : regular rhythm Lungs: Normal breathing effort, no conversational dyspnea. Neuro: no focal deficits  DIAGNOSTIC DATA REVIEWED:  BMET    Component Value Date/Time   NA 140 10/13/2021  1008   K 4.3 10/13/2021 1008   CL 101 10/13/2021 1008   CO2 23 10/13/2021 1008   GLUCOSE 88 10/13/2021 1008   GLUCOSE 118 (H) 08/06/2019 1215   BUN 8 10/13/2021 1008   CREATININE 0.75 10/13/2021 1008   CALCIUM 9.5 10/13/2021 1008   GFRNONAA >60 08/06/2019 1215   GFRAA >60 08/06/2019 1215   Lab Results  Component Value Date   HGBA1C 5.6 10/13/2021   HGBA1C 5.5 09/05/2016   Lab Results  Component Value Date   INSULIN 17.6 10/13/2021   Lab Results  Component Value Date   TSH 1.400 10/13/2021   CBC    Component Value Date/Time   WBC CANCELED 10/13/2021 1008   WBC 5.7 08/10/2019 0527   RBC CANCELED 10/13/2021 1008   RBC 4.70 08/10/2019 0527   HGB CANCELED 10/13/2021 1008   HCT CANCELED 10/13/2021 1008   PLT CANCELED 10/13/2021 1008   MCV CANCELED 10/13/2021 1008   MCH CANCELED 10/13/2021 1008   MCH 28.7 08/10/2019 0527   MCHC CANCELED 10/13/2021 1008   MCHC 32.3 08/10/2019 0527   RDW CANCELED 10/13/2021 1008   Iron Studies    Component Value Date/Time   FERRITIN 567 (H) 08/10/2019 0527   Lipid Panel     Component Value Date/Time   CHOL 185 10/13/2021 1008   TRIG 94 10/13/2021 1008   HDL 46 10/13/2021 1008   LDLCALC 122 (H) 10/13/2021 1008   Hepatic Function Panel     Component Value Date/Time   PROT 7.5  10/13/2021 1008   ALBUMIN 4.4 10/13/2021 1008   AST 13 10/13/2021 1008   ALT 17 10/13/2021 1008   ALKPHOS 81 10/13/2021 1008   BILITOT 0.2 10/13/2021 1008      Component Value Date/Time   TSH 1.400 10/13/2021 1008   Nutritional Lab Results  Component Value Date   VD25OH 26.8 (L) 10/13/2021    ASSOCIATED CONDITIONS ADDRESSED TODAY  ASSESSMENT AND PLAN  Problem List Items Addressed This Visit     Insulin resistance - Primary    Insulin Resistance Last fasting insulin was 17.6- not at goal. A1c was 5.6. Polyphagia:Yes Medication(s): None Previously on phentermine-topiramate, but stopped due to menorrhagia which has since resolved.  May want  to consider metformin Lab Results  Component Value Date   HGBA1C 5.6 10/13/2021   HGBA1C 5.5 09/17/2017   HGBA1C 5.5 09/05/2016   Lab Results  Component Value Date   INSULIN 17.6 10/13/2021   Plan:  Continue working on nutrition plan to decrease simple carbohydrates, increase lean proteins and exercise to promote weight loss, improve glycemic control and prevent progression to Type 2 diabetes.  Increase protein intake. Increase fiber intake.        Vitamin D deficiency    Vitamin D Deficiency Vitamin D is not at goal of 50.  Most recent vitamin D level was 26.8./[facility] 01/04/22 37.3 improving with supplementation.  She is on  prescription ergocalciferol 50,000 IU weekly. No side effects with Ergocalciferol  Lab Results  Component Value Date   VD25OH 26.8 (L) 10/13/2021   Plan: Continue and refill  prescription ergocalciferol 50,000 IU weekly Low vitamin D levels can be associated with adiposity and may result in leptin resistance and weight gain. Also associated with fatigue. Currently on vitamin D supplementation without any adverse effects.         Relevant Medications   Vitamin D, Ergocalciferol, (DRISDOL) 1.25 MG (50000 UNIT) CAPS capsule   Obesity ([place])- Start BMI 46.27   BMI 45.0-49.9, adult Currrent BMI 46.5   May want to consider other medications for weight loss such as contrave or adding metformin for insulin resistance.    TREATMENT PLAN FOR OBESITY:  Recommended Dietary Goals  Angelica Robles is currently in the action stage of change. As such, her goal is to continue weight management plan. She has agreed to the Category 3 Plan.  Behavioral Intervention  We discussed the following Behavioral Modification Strategies today: increasing lean protein intake, decreasing simple carbohydrates , avoiding skipping meals, increasing water intake, continue to work on implementation of reduced calorie nutritional plan, and planning for success.  Additional  resources provided today: NA  Recommended Physical Activity Goals  Angelica Robles has been advised to work up to 150 minutes of moderate intensity aerobic activity a week and strengthening exercises 2-3 times per week for cardiovascular health, weight loss maintenance and preservation of muscle mass.   She has agreed to Continue current level of physical activity    Pharmacotherapy We discussed various medication options to help Angelica Robles with her weight loss efforts and we both agreed to continue to work on nutritional and behavioral strategies to promote weight loss.    Return in about 4 weeks (around 05/04/2022).Marland Kitchen She was informed of the importance of frequent follow up visits to maximize her success with intensive lifestyle modifications for her multiple health conditions.   ATTESTASTION STATEMENTS:  Reviewed by clinician on day of visit: allergies, medications, problem list, medical history, surgical history, family history, social history, and previous encounter notes.  I have personally spent 33 minutes total time today in preparation, patient care, nutritional counseling and documentation for this visit, including the following: review of clinical lab tests; review of medical tests/procedures/services.      Kennen Stammer, PA-C

## 2022-04-06 NOTE — Assessment & Plan Note (Signed)
Insulin Resistance Last fasting insulin was 17.6- not at goal. A1c was 5.6. Polyphagia:Yes Medication(s): None Previously on phentermine-topiramate, but stopped due to menorrhagia which has since resolved.  May want to consider metformin Lab Results  Component Value Date   HGBA1C 5.6 10/13/2021   HGBA1C 5.5 09/17/2017   HGBA1C 5.5 09/05/2016   Lab Results  Component Value Date   INSULIN 17.6 10/13/2021    Plan:  Continue working on nutrition plan to decrease simple carbohydrates, increase lean proteins and exercise to promote weight loss, improve glycemic control and prevent progression to Type 2 diabetes.  Increase protein intake. Increase fiber intake.

## 2022-04-06 NOTE — Assessment & Plan Note (Signed)
Vitamin D Deficiency Vitamin D is not at goal of 50.  Most recent vitamin D level was 26.8./[facility] 01/04/22 37.3 improving with supplementation.  She is on  prescription ergocalciferol 50,000 IU weekly. No side effects with Ergocalciferol  Lab Results  Component Value Date   VD25OH 26.8 (L) 10/13/2021    Plan: Continue and refill  prescription ergocalciferol 50,000 IU weekly Low vitamin D levels can be associated with adiposity and may result in leptin resistance and weight gain. Also associated with fatigue. Currently on vitamin D supplementation without any adverse effects.

## 2022-05-03 ENCOUNTER — Ambulatory Visit (INDEPENDENT_AMBULATORY_CARE_PROVIDER_SITE_OTHER): Payer: Managed Care, Other (non HMO) | Admitting: Physician Assistant

## 2022-09-11 NOTE — Progress Notes (Unsigned)
    [facility]   No chief complaint on file.   HPI:      [name] is a 32 y.o. G0P0000 whose LMP was No LMP recorded. (Menstrual status: Irregular Periods)., presents today for *** Hx of BV and y east in past Her menses are every 2-3 months, normal for pt, lasting 3-5 days, mod flow, no BTB, mild dysmen, no meds needed. LMP lasted 2 wks and heavier with a little more cramping.     Patient Active Problem List   Diagnosis Date Noted   Insulin resistance 04/06/2022   Vitamin D deficiency 04/06/2022   Obesity (HCC)- Start BMI 46.27 04/06/2022   BMI 45.0-49.9, adult Currrent BMI 46.5 04/06/2022   MS (multiple sclerosis) (HCC) 08/11/2020   BV (bacterial vaginosis) 04/30/2020   COVID-19 08/06/2019   Obesity, Class III, BMI 40-49.9 (morbid obesity) (HCC) 08/06/2019   COVID-19 virus infection 08/06/2019    Past Surgical History:  Procedure Laterality Date   TONSILLECTOMY      Family History  Problem Relation Age of Onset   Cancer Father    Prostate cancer Father 45   Breast cancer Maternal Grandmother 57    Social History   Socioeconomic History   Marital status: Single    Spouse name: Not on file   Number of children: Not on file   Years of education: Not on file   Highest education level: Not on file  Occupational History   Occupation: [employer], work from home  Tobacco Use   Smoking status: Never   Smokeless tobacco: Never  Vaping Use   Vaping status: Never Used  Substance and Sexual Activity   Alcohol use: No   Drug use: No   Sexual activity: Not Currently    Birth control/protection: None  Other Topics Concern   Not on file  Social History Narrative   Not on file   Social Determinants of Health   Financial Resource Strain: Not on file  Food Insecurity: Not on file  Transportation Needs: Not on file  Physical Activity: Not on file  Stress: Not on file  Social Connections: Not on file  Intimate Partner Violence: Not on file     Outpatient Medications Prior to Visit  Medication Sig Dispense Refill   Ascorbic Acid (VITAMIN C) 1000 MG tablet Take 1,000 mg by mouth daily.     Cholecalciferol (VITAMIN D3) 50 MCG (2000 UT) CAPS Take by mouth.     cyanocobalamin (VITAMIN B12) 1000 MCG tablet Take by mouth.     KESIMPTA 20 MG/0.4ML SOAJ Inject into the skin.     Vitamin D, Ergocalciferol, (DRISDOL) 1.25 MG (50000 UNIT) CAPS capsule Take 1 capsule (50,000 Units total) by mouth every 7 (seven) days. 4 capsule 0   Vitamin E 670 MG (1000 UT) CAPS Take by mouth.     No facility-administered medications prior to visit.      ROS:  Review of Systems BREAST: No symptoms   OBJECTIVE:   Vitals:  There were no vitals taken for this visit.  Physical Exam  Results: No results found for this or any previous visit (from the past 24 hour(s)).   Assessment/Plan: No diagnosis found.    No orders of the defined types were placed in this encounter.     No follow-ups on file.  Sharnese Heath B. Eriyana Sweeten, PA-C 09/11/2022 1:39 PM

## 2022-09-12 ENCOUNTER — Ambulatory Visit: Payer: Managed Care, Other (non HMO) | Admitting: Obstetrics and Gynecology

## 2022-09-12 ENCOUNTER — Other Ambulatory Visit (HOSPITAL_COMMUNITY)
Admission: RE | Admit: 2022-09-12 | Discharge: 2022-09-12 | Disposition: A | Discharge status: Home / Self Care | Payer: Managed Care, Other (non HMO) | Source: Ambulatory Visit | Attending: Obstetrics and Gynecology | Admitting: Obstetrics and Gynecology

## 2022-09-12 ENCOUNTER — Encounter: Payer: Self-pay | Admitting: Obstetrics and Gynecology

## 2022-09-12 VITALS — BP 110/70 | Ht 61.0 in | Wt 250.0 lb

## 2022-09-12 DIAGNOSIS — N939 Abnormal uterine and vaginal bleeding, unspecified: Secondary | ICD-10-CM | POA: Diagnosis not present

## 2022-09-12 DIAGNOSIS — R1032 Left lower quadrant pain: Secondary | ICD-10-CM

## 2022-09-12 DIAGNOSIS — Z1151 Encounter for screening for human papillomavirus (HPV): Secondary | ICD-10-CM

## 2022-09-12 DIAGNOSIS — Z124 Encounter for screening for malignant neoplasm of cervix: Secondary | ICD-10-CM

## 2022-09-12 DIAGNOSIS — N898 Other specified noninflammatory disorders of vagina: Secondary | ICD-10-CM

## 2022-09-12 DIAGNOSIS — Z113 Encounter for screening for infections with a predominantly sexual mode of transmission: Secondary | ICD-10-CM

## 2022-09-12 DIAGNOSIS — R87810 Cervical high risk human papillomavirus (HPV) DNA test positive: Secondary | ICD-10-CM

## 2022-09-12 LAB — POCT WET PREP WITH KOH
Clue Cells Wet Prep HPF POC: NEGATIVE
KOH Prep POC: NEGATIVE
Trichomonas, UA: NEGATIVE
Yeast Wet Prep HPF POC: NEGATIVE

## 2022-09-14 LAB — CYTOLOGY - PAP
Comment: NEGATIVE
High risk HPV: NEGATIVE

## 2022-09-15 LAB — NUSWAB VAGINITIS PLUS (VG+)
Candida albicans, NAA: NEGATIVE
Candida glabrata, NAA: NEGATIVE
Chlamydia trachomatis, NAA: NEGATIVE
Neisseria gonorrhoeae, NAA: NEGATIVE
Trich vag by NAA: NEGATIVE

## 2022-10-04 ENCOUNTER — Other Ambulatory Visit: Payer: Managed Care, Other (non HMO)

## 2022-10-11 ENCOUNTER — Ambulatory Visit
Admission: RE | Admit: 2022-10-11 | Discharge: 2022-10-11 | Disposition: A | Discharge status: Home / Self Care | Payer: Managed Care, Other (non HMO) | Source: Ambulatory Visit | Attending: Obstetrics and Gynecology | Admitting: Obstetrics and Gynecology

## 2022-10-11 DIAGNOSIS — R1032 Left lower quadrant pain: Secondary | ICD-10-CM | POA: Diagnosis present

## 2022-10-11 DIAGNOSIS — N939 Abnormal uterine and vaginal bleeding, unspecified: Secondary | ICD-10-CM | POA: Insufficient documentation

## 2022-10-17 ENCOUNTER — Encounter: Payer: Self-pay | Admitting: Obstetrics and Gynecology

## 2022-10-17 ENCOUNTER — Ambulatory Visit: Payer: Managed Care, Other (non HMO) | Admitting: Obstetrics and Gynecology

## 2022-10-17 VITALS — BP 110/64 | Ht 61.0 in | Wt 253.0 lb

## 2022-10-17 DIAGNOSIS — R1032 Left lower quadrant pain: Secondary | ICD-10-CM

## 2022-10-17 DIAGNOSIS — Z01419 Encounter for gynecological examination (general) (routine) without abnormal findings: Secondary | ICD-10-CM

## 2022-10-17 DIAGNOSIS — N939 Abnormal uterine and vaginal bleeding, unspecified: Secondary | ICD-10-CM

## 2022-10-17 MED ORDER — MEDROXYPROGESTERONE ACETATE 10 MG PO TABS
10.0000 mg | ORAL_TABLET | Freq: Every day | ORAL | 0 refills | Status: DC
Start: 2022-10-17 — End: 2023-08-03

## 2022-10-17 NOTE — Patient Instructions (Signed)
I value your feedback and you entrusting [place] with your care. If you get a [place] patient survey, I would appreciate you taking the time to let us know about your experience today. Thank you! ? ? ?

## 2022-10-17 NOTE — Progress Notes (Signed)
PCP:  [facility]   Chief Complaint  Patient presents with   Gynecologic Exam    No concerns     HPI:      [name] is a 32 y.o. G0P0000 whose LMP was No LMP recorded (lmp unknown). (Menstrual status: Irregular Periods)., presents today for her annual examination.  Her menses are every 2-3 months due to hx of PCOS, normal for pt, lasting 3-5 days, mod flow, mild dysmen, no meds needed. Had LLQ pain 9/24 with neg GYN u/s. EM=13 mm but normal for premenopausal women. Pt with occas light spotting day after sex for a couple days or after drinking any alcohol. Hasn't been sexually active or had alcohol since last appt 09/12/22 and no BTB sx. Did OCPs in past with monthly menses initially and then amenorrhea.   Sex activity: not currently active; no pain/dryness when was active. Using condoms.  Last Pap: 09/12/22  Results were: LGSIL/neg HPV DNA, repeat pap in 1 yr per [facility] 09/02/21 NILM/POS HPV DNA, repeat in 1 yr  There is a FH of breast cancer in her MGM, genetic testing not indicated. There is no FH of ovarian cancer. The patient does not do self-breast exams.  Tobacco use: The patient denies current or previous tobacco use. Alcohol use: none No drug use.  Exercise: mod active  She does get adequate calcium and Vitamin D in her diet. Diagnosed with MS about a yr ago. Stable on meds.    Patient Active Problem List   Diagnosis Date Noted   Insulin resistance 04/06/2022   Vitamin D deficiency 04/06/2022   Obesity (HCC)- Start BMI 46.27 04/06/2022   BMI 45.0-49.9, adult Currrent BMI 46.5 04/06/2022   MS (multiple sclerosis) (HCC) 08/11/2020   BV (bacterial vaginosis) 04/30/2020   COVID-19 08/06/2019   Obesity, Class III, BMI 40-49.9 (morbid obesity) (HCC) 08/06/2019   COVID-19 virus infection 08/06/2019    Past Surgical History:  Procedure Laterality Date   TONSILLECTOMY      Family History  Problem Relation Age of Onset   Cancer Father     Prostate cancer Father 82   Breast cancer Maternal Grandmother 71    Social History   Socioeconomic History   Marital status: Single    Spouse name: Not on file   Number of children: Not on file   Years of education: Not on file   Highest education level: Not on file  Occupational History   Occupation: [employer], work from home  Tobacco Use   Smoking status: Never   Smokeless tobacco: Never  Vaping Use   Vaping status: Never Used  Substance and Sexual Activity   Alcohol use: No   Drug use: No   Sexual activity: Yes    Birth control/protection: None, Condom  Other Topics Concern   Not on file  Social History Narrative   Not on file   Social Determinants of Health   Financial Resource Strain: Not on file  Food Insecurity: Not on file  Transportation Needs: Not on file  Physical Activity: Not on file  Stress: Not on file  Social Connections: Not on file  Intimate Partner Violence: Not on file     Current Outpatient Medications:    Ascorbic Acid (VITAMIN C) 1000 MG tablet, Take 1,000 mg by mouth daily., Disp: , Rfl:    Cholecalciferol (VITAMIN D3) 50 MCG (2000 UT) CAPS, Take by mouth., Disp: , Rfl:    cyanocobalamin (VITAMIN B12) 1000 MCG tablet, Take  by mouth., Disp: , Rfl:    KESIMPTA 20 MG/0.4ML SOAJ, Inject into the skin., Disp: , Rfl:    medroxyPROGESTERone (PROVERA) 10 MG tablet, Take 1 tablet (10 mg total) by mouth daily for 7 days., Disp: 7 tablet, Rfl: 0   Vitamin E 670 MG (1000 UT) CAPS, Take by mouth., Disp: , Rfl:      ROS:  Review of Systems  Constitutional:  Negative for fatigue, fever and unexpected weight change.  Respiratory:  Negative for cough, shortness of breath and wheezing.   Cardiovascular:  Negative for chest pain, palpitations and leg swelling.  Gastrointestinal:  Negative for blood in stool, constipation, diarrhea, nausea and vomiting.  Endocrine: Negative for cold intolerance, heat intolerance and polyuria.  Genitourinary:  Negative  for dyspareunia, dysuria, flank pain, frequency, genital sores, hematuria, menstrual problem, pelvic pain, urgency, vaginal bleeding, vaginal discharge and vaginal pain.  Musculoskeletal:  Negative for back pain, joint swelling and myalgias.  Skin:  Negative for rash.  Neurological:  Negative for dizziness, syncope, light-headedness, numbness and headaches.  Hematological:  Negative for adenopathy.  Psychiatric/Behavioral:  Negative for agitation, confusion, sleep disturbance and suicidal ideas. The patient is not nervous/anxious.    BREAST: No symptoms   Objective: BP 110/64   Ht 5\' 1"  (1.549 m)   Wt 253 lb (114.8 kg)   LMP  (LMP Unknown)   BMI 47.80 kg/m    Physical Exam Constitutional:      Appearance: She is well-developed.  Genitourinary:     Vulva normal.     Right Labia: No rash, tenderness or lesions.    Left Labia: No tenderness, lesions or rash.    No vaginal discharge, erythema or tenderness.      Right Adnexa: not tender and no mass present.    Left Adnexa: not tender and no mass present.    No cervical friability or polyp.     Uterus is not enlarged or tender.  Breasts:    Right: No mass, nipple discharge, skin change or tenderness.     Left: No mass, nipple discharge, skin change or tenderness.  Neck:     Thyroid: No thyromegaly.  Cardiovascular:     Rate and Rhythm: Normal rate and regular rhythm.     Heart sounds: Normal heart sounds. No murmur heard. Pulmonary:     Effort: Pulmonary effort is normal.     Breath sounds: Normal breath sounds.  Abdominal:     Palpations: Abdomen is soft.     Tenderness: There is no abdominal tenderness. There is no guarding or rebound.  Musculoskeletal:        General: Normal range of motion.     Cervical back: Normal range of motion.  Lymphadenopathy:     Cervical: No cervical adenopathy.  Neurological:     General: No focal deficit present.     Mental Status: She is alert and oriented to person, place, and time.      Cranial Nerves: No cranial nerve deficit.  Skin:    General: Skin is warm and dry.  Psychiatric:        Mood and Affect: Mood normal.        Behavior: Behavior normal.        Thought Content: Thought content normal.        Judgment: Judgment normal.  Vitals reviewed.    Assessment/Plan: Encounter for annual routine gynecological examination  LLQ pain--last month, sx resolved after bleeding started. Neg GYN u/s. F/u prn.   Abnormal  uterine bleeding (AUB) - Plan: medroxyPROGESTERone (PROVERA) 10 MG tablet; occas after sex or after alcohol use. Neg GYN u/s. Discussed BC to regulate cycles due to PCOS vs Rx provera for 1 dose to make sure EM lining comes out fully. Pt opts for provera. Rx eRxd, pt to take after upcoming vacation and after neg UPT. F/u prn. Not interested in OCPs.     Meds ordered this encounter  Medications   medroxyPROGESTERone (PROVERA) 10 MG tablet    Sig: Take 1 tablet (10 mg total) by mouth daily for 7 days.    Dispense:  7 tablet    Refill:  0    Order Specific Question:   Supervising Provider    Answer:   Waymon Budge             GYN counsel adequate intake of calcium and vitamin D, diet and exercise     F/U  Return in about 1 year (around 10/17/2023).  Nolene Rocks B. Sergio Hobart, PA-C 10/17/2022 4:31 PM

## 2023-08-03 ENCOUNTER — Other Ambulatory Visit (HOSPITAL_COMMUNITY)
Admission: RE | Admit: 2023-08-03 | Discharge: 2023-08-03 | Disposition: A | Discharge status: Home / Self Care | Source: Ambulatory Visit | Attending: Obstetrics and Gynecology | Admitting: Obstetrics and Gynecology

## 2023-08-03 ENCOUNTER — Ambulatory Visit

## 2023-08-03 VITALS — BP 129/81 | HR 92 | Ht 61.0 in | Wt 263.3 lb

## 2023-08-03 DIAGNOSIS — N9489 Other specified conditions associated with female genital organs and menstrual cycle: Secondary | ICD-10-CM | POA: Insufficient documentation

## 2023-08-03 DIAGNOSIS — N898 Other specified noninflammatory disorders of vagina: Secondary | ICD-10-CM | POA: Insufficient documentation

## 2023-08-03 MED ORDER — FLUCONAZOLE 150 MG PO TABS: 150.0000 mg | ORAL_TABLET | Freq: Every day | ORAL | 0 refills | Status: AC

## 2023-08-03 NOTE — Progress Notes (Signed)
    NURSE VISIT NOTE  Subjective:    Patient ID: Angelica Robles, female    DOB: March 04, 1990, 33 y.o.   MRN: [id number]  HPI  Patient is a 33 y.o. G0P0000 female who presents for odorless vaginal discharge for 3 day(s). Denies abnormal vaginal bleeding or significant pelvic pain or fever. denies urinary frequency, abdominal pain, pelvic pain, and vaginal discharge. Patient denies history of known exposure to STD.   Objective:    BP 129/81   Pulse 92   Ht 5' 1 (1.549 m)   Wt 263 lb 4.8 oz (119.4 kg)   LMP 07/23/2023 (Exact Date)   BMI 49.75 kg/m    @THIS  VISIT ONLY@  Assessment:   1. Vaginal itching   2. Vaginal burning       Plan:   GC and chlamydia DNA  probe sent to lab. Sent Diflucan  150mg  one time dose. ROV prn if symptoms persist or worsen.   Mathis LITTIE Getting, CMA

## 2023-08-07 LAB — CERVICOVAGINAL ANCILLARY ONLY
Bacterial Vaginitis (gardnerella): POSITIVE — AB
Candida Glabrata: NEGATIVE
Candida Vaginitis: POSITIVE — AB
Chlamydia: NEGATIVE
Comment: NEGATIVE
Comment: NEGATIVE
Comment: NEGATIVE
Comment: NEGATIVE
Comment: NEGATIVE
Comment: NORMAL
Neisseria Gonorrhea: NEGATIVE
Trichomonas: NEGATIVE

## 2023-08-08 ENCOUNTER — Ambulatory Visit: Payer: Self-pay

## 2023-08-08 ENCOUNTER — Other Ambulatory Visit: Payer: Self-pay

## 2023-08-08 DIAGNOSIS — B9689 Other specified bacterial agents as the cause of diseases classified elsewhere: Secondary | ICD-10-CM

## 2023-08-08 MED ORDER — METRONIDAZOLE 0.75 % VA GEL: 1.0000 | Freq: Every day | VAGINAL | 0 refills | Status: AC

## 2023-08-08 MED ORDER — METRONIDAZOLE 500 MG PO TABS: 500.0000 mg | ORAL_TABLET | Freq: Two times a day (BID) | ORAL | 0 refills | Status: AC

## 2023-12-06 ENCOUNTER — Ambulatory Visit: Admitting: Podiatry

## 2023-12-06 DIAGNOSIS — L989 Disorder of the skin and subcutaneous tissue, unspecified: Secondary | ICD-10-CM | POA: Diagnosis not present

## 2023-12-06 NOTE — Progress Notes (Unsigned)
  Subjective:  Patient ID: Angelica Robles, female    DOB: 31-Oct-1990,  MRN: [id number]  Chief Complaint  Patient presents with   Callouses    33 y.o. female presents with the above complaint.  Patient presents with left fifth metatarsal base porokeratotic lesion painful to touch is progressive gotten worse worse with ambulation or shoe pressure she would like to discuss treatment options for this.  She states it hurts while she is ambulating pain scale 7 out of 10 dull aching nature she tried some conservative treatment options which has not helped.  She wishes to discuss more advanced treatment options   Review of Systems: Negative except as noted in the HPI. Denies N/V/F/Ch.  Past Medical History:  Diagnosis Date   Back pain    Multiple sclerosis    Oligomenorrhea    PCOS (polycystic ovarian syndrome)    SOB (shortness of breath)    Vitamin D  deficiency     Current Outpatient Medications:    Ascorbic Acid  (VITAMIN C) 1000 MG tablet, Take 1,000 mg by mouth daily., Disp: , Rfl:    Cholecalciferol (VITAMIN D3) 50 MCG (2000 UT) CAPS, Take by mouth., Disp: , Rfl:    cyanocobalamin (VITAMIN B12) 1000 MCG tablet, Take by mouth., Disp: , Rfl:    fluconazole  (DIFLUCAN ) 150 MG tablet, Take 1 tablet (150 mg total) by mouth daily., Disp: 1 tablet, Rfl: 0   KESIMPTA 20 MG/0.4ML SOAJ, Inject into the skin., Disp: , Rfl:    metroNIDAZOLE  (FLAGYL ) 500 MG tablet, Take 1 tablet (500 mg total) by mouth 2 (two) times daily., Disp: 14 tablet, Rfl: 0   Vitamin E 670 MG (1000 UT) CAPS, Take by mouth., Disp: , Rfl:   Social History   Tobacco Use  Smoking Status Never  Smokeless Tobacco Never    No Known Allergies Objective:  There were no vitals filed for this visit. There is no height or weight on file to calculate BMI. Constitutional Well developed. Well nourished.  Vascular Dorsalis pedis pulses palpable bilaterally. Posterior tibial pulses palpable bilaterally. Capillary refill normal to  all digits.  No cyanosis or clubbing noted. Pedal hair growth normal.  Neurologic Normal speech. Oriented to person, place, and time. Epicritic sensation to light touch grossly present bilaterally.  Dermatologic Left fifth metatarsal base porokeratosis/benign skin lesion central nucleated core noted.  No pinpoint bleeding noted upon debridement  Orthopedic: Normal joint ROM without pain or crepitus bilaterally. No visible deformities. No bony tenderness.   Radiographs: None Assessment:   1. Benign skin lesion    Plan:  Patient was evaluated and treated and all questions answered.  Left fifth metatarsal base benign skin lesion/porokeratosis - All questions and concerns were discussed with the patient in extensive detail given the amount of pain that she is experiencing should benefit from debridement of the lesion followed by application of Cantharone patient agrees with plan to proceed with Cantharone medication --Lesion was debrided today without complications. Hemostasis was achieved and the area was cleaned. Cantharone was applied followed by an occlusive bandage. Post procedure complications were discussed. Monitor for signs or symptoms of infection and directed to call the office mainly should any occur.   No follow-ups on file.

## 2023-12-20 ENCOUNTER — Ambulatory Visit: Admitting: Podiatry

## 2023-12-20 DIAGNOSIS — L989 Disorder of the skin and subcutaneous tissue, unspecified: Secondary | ICD-10-CM | POA: Diagnosis not present

## 2023-12-20 NOTE — Progress Notes (Unsigned)
  Subjective:  Patient ID: Angelica Robles, female    DOB: [birth year],  MRN: [id number]  No chief complaint on file.   33 y.o. female presents with the above complaint.  Patient presents for follow-up of right submetatarsal 4 and fourth and space plantar verruca.  She states is doing better the Cantharone therapy is helping.  She is here for another application.   Review of Systems: Negative except as noted in the HPI. Denies N/V/F/Ch.  Past Medical History:  Diagnosis Date   Medical history non-contributory    Migraines     Current Outpatient Medications:    acetaminophen (TYLENOL) 500 MG tablet, Take 500 mg by mouth every 6 (six) hours as needed for moderate pain., Disp: , Rfl:    benzocaine-Menthol (DERMOPLAST) 20-0.5 % AERO, Apply 1 application topically as needed for irritation (perineal discomfort)., Disp: , Rfl:    coconut oil OIL, Apply 1 application topically as needed., Disp: , Rfl: 0   famotidine (PEPCID) 10 MG tablet, Take 10 mg by mouth 2 (two) times daily as needed for heartburn or indigestion., Disp: , Rfl:    ibuprofen (ADVIL,MOTRIN) 600 MG tablet, Take 1 tablet (600 mg total) by mouth every 6 (six) hours., Disp: 30 tablet, Rfl: 0   Prenatal Vit-Fe Fumarate-FA (PRENATAL MULTIVITAMIN) TABS tablet, Take 1 tablet by mouth daily at 12 noon., Disp: , Rfl:   Social History   Tobacco Use  Smoking Status Never  Smokeless Tobacco Never    Allergies  Allergen Reactions   Sulfa Antibiotics Nausea Only   Objective:   Vitals:   12/02/21 0923  BP: (!) 116/46   There is no height or weight on file to calculate BMI. Constitutional Well developed. Well nourished.  Vascular Dorsalis pedis pulses palpable bilaterally. Posterior tibial pulses palpable bilaterally. Capillary refill normal to all digits.  No cyanosis or clubbing noted. Pedal hair growth normal.  Neurologic Normal speech. Oriented to person, place, and time. Epicritic sensation to light touch grossly  present bilaterally.  Dermatologic Plantar verruca noted to right submetatarsal 5.  Pinpoint bleeding noted upon debridement.  Orthopedic: Normal joint ROM without pain or crepitus bilaterally. No visible deformities. No bony tenderness.   Radiographs: None Assessment:   1. Plantar verruca      Plan:  Patient was evaluated and treated and all questions answered.  Right submetatarsal 5 plantar verruca~fourth application --Lesion was debrided today without complications. Hemostasis was achieved and the area was cleaned. Cantharone was applied followed by an occlusive bandage. Post procedure complications were discussed. Monitor for signs or symptoms of infection and directed to call the office mainly should any occur. -Continue to apply Cantharone until resolved  No follow-ups on file.

## 2024-01-10 ENCOUNTER — Ambulatory Visit: Admitting: Podiatry

## 2024-01-10 DIAGNOSIS — L989 Disorder of the skin and subcutaneous tissue, unspecified: Secondary | ICD-10-CM | POA: Diagnosis not present

## 2024-01-10 NOTE — Progress Notes (Signed)
"  °  Subjective:  Patient ID: Angelica Robles, female    DOB: 1990/09/25,  MRN: [id number]  Chief Complaint  Patient presents with   Benign skin lesion    Pt stated that she is doing well no concerns or issues at this time     34 y.o. female presents with the above complaint.  Patient presents for follow-up of left fifth metatarsal base porokeratotic lesion doing a lot better she has not felt any discomfort.  She was able to tolerate the medication   Review of Systems: Negative except as noted in the HPI. Denies N/V/F/Ch.  Past Medical History:  Diagnosis Date   Back pain    Multiple sclerosis    Oligomenorrhea    PCOS (polycystic ovarian syndrome)    SOB (shortness of breath)    Vitamin D  deficiency     Current Outpatient Medications:    Ascorbic Acid  (VITAMIN C) 1000 MG tablet, Take 1,000 mg by mouth daily., Disp: , Rfl:    Cholecalciferol (VITAMIN D3) 50 MCG (2000 UT) CAPS, Take by mouth., Disp: , Rfl:    cyanocobalamin (VITAMIN B12) 1000 MCG tablet, Take by mouth., Disp: , Rfl:    fluconazole  (DIFLUCAN ) 150 MG tablet, Take 1 tablet (150 mg total) by mouth daily., Disp: 1 tablet, Rfl: 0   KESIMPTA 20 MG/0.4ML SOAJ, Inject into the skin., Disp: , Rfl:    metroNIDAZOLE  (FLAGYL ) 500 MG tablet, Take 1 tablet (500 mg total) by mouth 2 (two) times daily., Disp: 14 tablet, Rfl: 0   Vitamin E 670 MG (1000 UT) CAPS, Take by mouth., Disp: , Rfl:   Social History   Tobacco Use  Smoking Status Never  Smokeless Tobacco Never    No Known Allergies Objective:  There were no vitals filed for this visit. There is no height or weight on file to calculate BMI. Constitutional Well developed. Well nourished.  Vascular Dorsalis pedis pulses palpable bilaterally. Posterior tibial pulses palpable bilaterally. Capillary refill normal to all digits.  No cyanosis or clubbing noted. Pedal hair growth normal.  Neurologic Normal speech. Oriented to person, place, and time. Epicritic sensation  to light touch grossly present bilaterally.  Dermatologic No further left fifth metatarsal porokeratotic lesion noted.  No further benign skin lesion noted.  She is no pinpoint bleeding noted upon debridement~improving  Orthopedic: Normal joint ROM without pain or crepitus bilaterally. No visible deformities. No bony tenderness.   Radiographs: None Assessment:   No diagnosis found.  Plan:  Patient was evaluated and treated and all questions answered.  Left fifth metatarsal base benign skin lesion/porokeratosis~second application - Clinically healed and officially discharged from the care if any foot and ankle issues on future she will come back and see me.  I discussed shoe gear modification as well.  She states understanding   No follow-ups on file. "
